# Patient Record
Sex: Female | Born: 1990 | Race: Black or African American | Hispanic: No | Marital: Single | Smoking: Never smoker
Health system: Southern US, Community
[De-identification: ages and names within clinical notes are randomized; demographics above are authoritative.]

## PROBLEM LIST (undated history)

## (undated) DIAGNOSIS — Z789 Other specified health status: Secondary | ICD-10-CM

## (undated) HISTORY — PX: NO PAST SURGERIES: SHX2092

---

## 2009-07-15 ENCOUNTER — Emergency Department (HOSPITAL_COMMUNITY): Admission: EM | Admit: 2009-07-15 | Discharge: 2009-07-15 | Payer: Self-pay | Admitting: Emergency Medicine

## 2010-01-04 ENCOUNTER — Emergency Department (HOSPITAL_COMMUNITY)
Admission: EM | Admit: 2010-01-04 | Discharge: 2010-01-04 | Payer: Self-pay | Source: Home / Self Care | Admitting: Emergency Medicine

## 2010-05-16 LAB — URINE MICROSCOPIC-ADD ON

## 2010-05-16 LAB — CSF CELL COUNT WITH DIFFERENTIAL: WBC, CSF: 2 /mm3 (ref 0–5)

## 2010-05-16 LAB — BASIC METABOLIC PANEL
BUN: 13 mg/dL (ref 6–23)
Chloride: 102 mEq/L (ref 96–112)
Creatinine, Ser: 1.15 mg/dL (ref 0.4–1.2)
GFR calc non Af Amer: >60 mL/min (ref >60)
Glucose, Bld: 152 mg/dL — ABNORMAL HIGH (ref 70–99)
Potassium: 3.7 mEq/L (ref 3.5–5.1)

## 2010-05-16 LAB — CULTURE, BLOOD (ROUTINE X 2): Culture: NO GROWTH

## 2010-05-16 LAB — URINALYSIS, ROUTINE W REFLEX MICROSCOPIC
Bilirubin Urine: NEGATIVE
Glucose, UA: NEGATIVE mg/dL
Ketones, ur: NEGATIVE mg/dL
Protein, ur: 100 mg/dL — AB

## 2010-05-16 LAB — GLUCOSE, CSF: Glucose, CSF: 90 mg/dL — ABNORMAL HIGH (ref 43–76)

## 2010-05-16 LAB — CBC
HCT: 31.5 % — ABNORMAL LOW (ref 36.0–46.0)
MCV: 68.8 fL — ABNORMAL LOW (ref 78.0–100.0)
Platelets: 172 10*3/uL (ref 150–400)
RDW: 21.2 % — ABNORMAL HIGH (ref 11.5–15.5)

## 2010-05-16 LAB — HERPES SIMPLEX VIRUS(HSV) DNA BY PCR: HSV 2 DNA: NOT DETECTED

## 2010-05-16 LAB — CSF CULTURE W GRAM STAIN

## 2010-05-16 LAB — PROTEIN, CSF: Total  Protein, CSF: 20 mg/dL (ref 15–45)

## 2010-05-16 LAB — URINE CULTURE

## 2012-04-07 ENCOUNTER — Emergency Department (HOSPITAL_COMMUNITY)
Admission: EM | Admit: 2012-04-07 | Discharge: 2012-04-07 | Disposition: A | Discharge status: Home / Self Care | Payer: Self-pay | Attending: Emergency Medicine | Admitting: Emergency Medicine

## 2012-04-07 ENCOUNTER — Encounter (HOSPITAL_COMMUNITY): Payer: Self-pay | Admitting: *Deleted

## 2012-04-07 DIAGNOSIS — B9689 Other specified bacterial agents as the cause of diseases classified elsewhere: Secondary | ICD-10-CM

## 2012-04-07 DIAGNOSIS — N760 Acute vaginitis: Secondary | ICD-10-CM | POA: Insufficient documentation

## 2012-04-07 DIAGNOSIS — Z3202 Encounter for pregnancy test, result negative: Secondary | ICD-10-CM | POA: Insufficient documentation

## 2012-04-07 LAB — WET PREP, GENITAL
Trich, Wet Prep: NONE SEEN
Yeast Wet Prep HPF POC: NONE SEEN

## 2012-04-07 LAB — URINALYSIS, ROUTINE W REFLEX MICROSCOPIC
Glucose, UA: NEGATIVE mg/dL
Ketones, ur: NEGATIVE mg/dL
Leukocytes, UA: NEGATIVE
pH: 6 (ref 5.0–8.0)

## 2012-04-07 LAB — POCT PREGNANCY, URINE: Preg Test, Ur: NEGATIVE

## 2012-04-07 MED ORDER — METRONIDAZOLE 500 MG PO TABS: 500.0000 mg | ORAL_TABLET | Freq: Two times a day (BID) | ORAL | Status: DC

## 2012-04-07 NOTE — ED Provider Notes (Signed)
History/physical exam/procedure(s) were performed by non-physician practitioner and as supervising physician I was immediately available for consultation/collaboration. I have reviewed all notes and am in agreement with care and plan.   Hilario Quarry, MD 04/07/12 [phone]

## 2012-04-07 NOTE — ED Provider Notes (Signed)
History     CSN: [id number]  Arrival date & time 04/07/12  1019   First MD Initiated Contact with Patient 04/07/12 1148      No chief complaint on file.   (Consider location/radiation/quality/duration/timing/severity/associated sxs/prior treatment) Patient is a 22 y.o. female presenting with STD exposure. The history is provided by the patient. No language interpreter was used.  Exposure to STD This is a new problem. The current episode started in the past 7 days. The problem has been unchanged. Pertinent negatives include no fever, nausea or vomiting. Nothing aggravates the symptoms. She has tried nothing for the symptoms.   22 year old female coming in with complaint of exposure to STD. He was she was dating a female partner for 3 years and he has been sleeping around. His girl friend told the patient that she had trich says she wants to be checked for trichomoniasis. She has no vaginal discharge. She has no abdominal pain. No nausea vomiting or fever.   History reviewed. No pertinent past medical history.  History reviewed. No pertinent past surgical history.  History reviewed. No pertinent family history.  History  Substance Use Topics  . Smoking status: Never Smoker   . Smokeless tobacco: Never Used  . Alcohol Use: No    OB History   Grav Para Term Preterm Abortions TAB SAB Ect Mult Living                  Review of Systems  Constitutional: Negative.  Negative for fever.  HENT: Negative.   Eyes: Negative.   Respiratory: Negative.   Cardiovascular: Negative.   Gastrointestinal: Negative.  Negative for nausea and vomiting.  Neurological: Negative.   Psychiatric/Behavioral: Negative.   All other systems reviewed and are negative.    Allergies  Review of patient's allergies indicates no known allergies.  Home Medications   Current Outpatient Rx  Name  Route  Sig  Dispense  Refill  . metroNIDAZOLE (FLAGYL) 500 MG tablet   Oral   Take 1 tablet (500 mg total) by  mouth 2 (two) times daily.   14 tablet   0     BP 103/65  Pulse 65  Temp(Src) 98.2 F (36.8 C) (Oral)  Resp 18  SpO2 100%  LMP 03/28/2012  Physical Exam  Nursing note and vitals reviewed. Constitutional: She is oriented to person, place, and time. She appears well-developed and well-nourished.  HENT:  Head: Normocephalic and atraumatic.  Eyes: Conjunctivae and EOM are normal. Pupils are equal, round, and reactive to light.  Neck: Normal range of motion. Neck supple.  Cardiovascular: Normal rate.   Pulmonary/Chest: Effort normal.  Abdominal: Soft.  Genitourinary: Vaginal discharge found.  Musculoskeletal: Normal range of motion. She exhibits no edema and no tenderness.  Neurological: She is alert and oriented to person, place, and time. She has normal reflexes.  Skin: Skin is warm and dry.  Psychiatric: She has a normal mood and affect.    ED Course  Pelvic exam Date/Time: 04/07/2012 1:43 PM Performed by: Remi Haggard Authorized by: Remi Haggard Consent: Verbal consent obtained. Risks and benefits: risks, benefits and alternatives were discussed Consent given by: patient Patient understanding: patient states understanding of the procedure being performed Patient identity confirmed: verbally with patient, arm band, provided demographic data and hospital-assigned identification number Preparation: Patient was prepped and draped in the usual sterile fashion. Local anesthesia used: no Patient sedated: no Patient tolerance: Patient tolerated the procedure well with no immediate complications.   (including critical care time)  Labs Reviewed  WET PREP, GENITAL - Abnormal; Notable for the following:    Clue Cells Wet Prep HPF POC MODERATE (*)    All other components within normal limits  URINALYSIS, ROUTINE W REFLEX MICROSCOPIC - Abnormal; Notable for the following:    APPearance CLOUDY (*)    All other components within normal limits  GC/CHLAMYDIA PROBE AMP  POCT  PREGNANCY, URINE   No results found.   1. Bacterial vaginitis       MDM   bacterial vaginosis. Prescription for Flagyl. She'll followup with free [facility]D clinic in one week.        Remi Haggard, NP 04/07/12 1344

## 2012-04-07 NOTE — ED Notes (Signed)
Pt states she's been with the same guy x 3 years, found out he slept with another girl, girl stated she had std's, one was trichomoniasis, pt states a week or two ago noticed an abnormal discharge, unable to describe discharge. Then states had a cut in vaginal area that is healed now.

## 2012-04-07 NOTE — ED Provider Notes (Signed)
Patient signed out to me by Dr. Rulon Abide after patient had her telemetry psychiatry consult it was recommended that she be admitted for inpatient treatment. Medication recommendations were made and the abdomen entered with the exception of Geodon because the facility does not have that dose  Toy Baker, MD 04/07/12 1143

## 2012-04-09 LAB — GC/CHLAMYDIA PROBE AMP: CT Probe RNA: NEGATIVE

## 2012-04-30 ENCOUNTER — Encounter (HOSPITAL_COMMUNITY): Payer: Self-pay | Admitting: Emergency Medicine

## 2012-04-30 ENCOUNTER — Emergency Department (HOSPITAL_COMMUNITY)
Admission: EM | Admit: 2012-04-30 | Discharge: 2012-04-30 | Disposition: A | Discharge status: Home / Self Care | Payer: Self-pay | Attending: Emergency Medicine | Admitting: Emergency Medicine

## 2012-04-30 DIAGNOSIS — T394X2A Poisoning by antirheumatics, not elsewhere classified, intentional self-harm, initial encounter: Secondary | ICD-10-CM | POA: Insufficient documentation

## 2012-04-30 DIAGNOSIS — Z3202 Encounter for pregnancy test, result negative: Secondary | ICD-10-CM | POA: Insufficient documentation

## 2012-04-30 DIAGNOSIS — T398X2A Poisoning by other nonopioid analgesics and antipyretics, not elsewhere classified, intentional self-harm, initial encounter: Secondary | ICD-10-CM | POA: Insufficient documentation

## 2012-04-30 DIAGNOSIS — T391X1A Poisoning by 4-Aminophenol derivatives, accidental (unintentional), initial encounter: Secondary | ICD-10-CM | POA: Insufficient documentation

## 2012-04-30 LAB — SALICYLATE LEVEL: Salicylate Lvl: <2 mg/dL — ABNORMAL LOW (ref 2.8–20.0)

## 2012-04-30 LAB — ACETAMINOPHEN LEVEL: Acetaminophen (Tylenol), Serum: 84.9 ug/mL — ABNORMAL HIGH (ref 10–30)

## 2012-04-30 LAB — RAPID URINE DRUG SCREEN, HOSP PERFORMED
Amphetamines: NOT DETECTED
Benzodiazepines: NOT DETECTED
Cocaine: NOT DETECTED
Opiates: NOT DETECTED

## 2012-04-30 LAB — COMPREHENSIVE METABOLIC PANEL
AST: 20 U/L (ref 0–37)
Albumin: 4.3 g/dL (ref 3.5–5.2)
BUN: 9 mg/dL (ref 6–23)
Chloride: 104 mEq/L (ref 96–112)
Creatinine, Ser: 0.75 mg/dL (ref 0.50–1.10)
Total Bilirubin: 0.5 mg/dL (ref 0.3–1.2)
Total Protein: 8.5 g/dL — ABNORMAL HIGH (ref 6.0–8.3)

## 2012-04-30 LAB — CBC
HCT: 44.3 % (ref 36.0–46.0)
MCHC: 32.7 g/dL (ref 30.0–36.0)
MCV: 86 fL (ref 78.0–100.0)
RDW: 13.7 % (ref 11.5–15.5)

## 2012-04-30 LAB — PREGNANCY, URINE: Preg Test, Ur: NEGATIVE

## 2012-04-30 MED ORDER — ALUM & MAG HYDROXIDE-SIMETH 200-200-20 MG/5ML PO SUSP: 30.0000 mL | ORAL | Status: DC | PRN

## 2012-04-30 MED ORDER — ONDANSETRON HCL 4 MG PO TABS: 4.0000 mg | ORAL_TABLET | Freq: Three times a day (TID) | ORAL | Status: DC | PRN

## 2012-04-30 MED ORDER — IBUPROFEN 600 MG PO TABS: 600.0000 mg | ORAL_TABLET | Freq: Three times a day (TID) | ORAL | Status: DC | PRN

## 2012-04-30 MED ORDER — NICOTINE 21 MG/24HR TD PT24: 21.0000 mg | MEDICATED_PATCH | Freq: Every day | TRANSDERMAL | Status: DC

## 2012-04-30 MED ORDER — ZOLPIDEM TARTRATE 5 MG PO TABS: 5.0000 mg | ORAL_TABLET | Freq: Every evening | ORAL | Status: DC | PRN

## 2012-04-30 MED ORDER — SODIUM CHLORIDE 0.9 % IV BOLUS (SEPSIS)
500.0000 mL | Freq: Once | INTRAVENOUS | Status: AC
  Administered 2012-04-30: 500 mL via INTRAVENOUS

## 2012-04-30 MED ORDER — LORAZEPAM 1 MG PO TABS: 1.0000 mg | ORAL_TABLET | Freq: Three times a day (TID) | ORAL | Status: DC | PRN

## 2012-04-30 NOTE — ED Notes (Signed)
Pt lethargic at this time. Eye open upon verbal stimulation.

## 2012-04-30 NOTE — ED Notes (Signed)
Pt presenting to ed with c/o taking approximately 9 extra strength pain reliever PM. Pt states she was trying to go to sleep forever. Pt states she took medication at 8:00am. Pt is alert at the time. Pt admits to SI attempt.

## 2012-04-30 NOTE — ED Notes (Signed)
Poison control called about update status on pt.

## 2012-04-30 NOTE — ED Notes (Signed)
I attempted x2 unsuccessfully to obtain blood. RN Taquita notified.

## 2012-04-30 NOTE — ED Notes (Signed)
One bag in locker 34 

## 2012-04-30 NOTE — ED Notes (Signed)
Posion Control recommends that we repeat a tylenol level in 1 hour and supportive care. Follow up with psych consult. Monitor until non symptomatic.

## 2012-04-30 NOTE — ED Notes (Signed)
Pt arrived to unit; no s/s of distress noted. Pt pleasant and cooperative with care.

## 2012-04-30 NOTE — ED Notes (Signed)
Sitter at bedside.

## 2012-04-30 NOTE — Progress Notes (Signed)
Telepsych completed, recommending patient can discharge home. Pt reports to this Clinical research associate remorse for actions. Pt reports remorse and looking forward to the future in joining the [employer]. Pt able to contract for safety. CSW and pt discussed outpatient resources. Pt discussed with EDP. Pt psychiatrically and medically stable for discharge.   Doree Albee  [phone] 04/30/2012 10:53pm

## 2012-04-30 NOTE — ED Notes (Signed)
Poison control called to check status of pt.

## 2012-04-30 NOTE — ED Provider Notes (Signed)
History     CSN: [id number]  Arrival date & time 04/30/12  1146   First MD Initiated Contact with Patient 04/30/12 1151      Chief Complaint  Patient presents with  . Medical Clearance    (Consider location/radiation/quality/duration/timing/severity/associated sxs/prior treatment) HPI A LEVEL 5 CAVEAT PERTAINS DUE TO ALTERED MENTAL STATUS Pt presents with intentional overdose of generic tylenol pm.  She states she took 9 pills at 9am.  States this was an attempt to "sleep forever".   History reviewed. No pertinent past medical history.  History reviewed. No pertinent past surgical history.  No family history on file.  History  Substance Use Topics  . Smoking status: Never Smoker   . Smokeless tobacco: Never Used  . Alcohol Use: No    OB History   Grav Para Term Preterm Abortions TAB SAB Ect Mult Living                  Review of Systems UNABLE TO OBTAIN ROS DUE TO LEVEL 5 CAVEAT  Allergies  Review of patient's allergies indicates no known allergies.  Home Medications   Current Outpatient Rx  Name  Route  Sig  Dispense  Refill  . diphenhydramine-acetaminophen (TYLENOL PM) 25-500 MG TABS   Oral   Take 1 tablet by mouth at bedtime as needed (sleep).           BP 111/72  Pulse 72  Temp(Src) 98.3 F (36.8 C) (Oral)  Resp 16  SpO2 100%  LMP 04/23/2012 Vitals reviewed Physical Exam Physical Examination: General appearance - somnolent, arousable to voice and pain, and in no distress Mental status - somnolent, oriented to person and place Eyes - pupils equal and reactive, extraocular eye movements intact, no nystagmus, no scleral icterus, no conjunctival injection Mouth - mucous membranes moist, pharynx normal without lesions Chest - clear to auscultation, no wheezes, rales or rhonchi, symmetric air entry Heart - normal rate, regular rhythm, normal S1, S2, no murmurs, rubs, clicks or gallops Abdomen - soft, nontender, nondistended, no masses or  organomegaly, nabs Neurological - alert, oriented, normal speech, no focal findings or movement disorder noted Extremities - peripheral pulses normal, no pedal edema, no clubbing or cyanosis Skin - normal coloration and turgor, no rashes Psych- flat affect  ED Course  Procedures (including critical care time)   Date: 04/30/2012  Rate: 104  Rhythm: sinus tachycardia  QRS Axis: normal  Intervals: normal  ST/T Wave abnormalities: nonspecific T wave changes  Conduction Disutrbances:none  Narrative Interpretation:   Old EKG Reviewed: no significant changes compared to prior ekg of 07/15/09  4:10 PM pt rechecked and is much more awake and alert.  She does endorse taking the pills at 9am.  She does endorse that this was a suicide attempt.  Pt is now medically cleared, 4 hour tylenol is under the level for treatment.  Psych holding orders written and pt has a sitter at her bedside.  Pt to be moved to psych ED.    Labs Reviewed  CBC - Abnormal; Notable for the following:    RBC 5.15 (*)    All other components within normal limits  COMPREHENSIVE METABOLIC PANEL - Abnormal; Notable for the following:    Potassium 3.4 (*)    Glucose, Bld 102 (*)    Total Protein 8.5 (*)    All other components within normal limits  ACETAMINOPHEN LEVEL - Abnormal; Notable for the following:    Acetaminophen (Tylenol), Serum 84.9 (*)  All other components within normal limits  SALICYLATE LEVEL - Abnormal; Notable for the following:    Salicylate Lvl <2.0 (*)    All other components within normal limits  ACETAMINOPHEN LEVEL - Abnormal; Notable for the following:    Acetaminophen (Tylenol), Serum 71.1 (*)    All other components within normal limits  URINE RAPID DRUG SCREEN (HOSP PERFORMED)  PREGNANCY, URINE   No results found.   1. Drug overdose, initial encounter       MDM  Pt presenting via EMS after intentional overdose of tylenol pm.  Pt has been observed x 4 hours in ED, tylenol level at 4  hours after ingestion is below nomogram for treatment and trending down.  Pt medically cleared, moved to psych ED for further evaluation.  Psych holding orders written        Ethelda Chick, MD 05/07/12 [phone]

## 2013-12-16 ENCOUNTER — Emergency Department (HOSPITAL_COMMUNITY)
Admission: EM | Admit: 2013-12-16 | Discharge: 2013-12-17 | Disposition: A | Discharge status: Home / Self Care | Payer: Self-pay | Attending: Emergency Medicine | Admitting: Emergency Medicine

## 2013-12-16 ENCOUNTER — Encounter (HOSPITAL_COMMUNITY): Payer: Self-pay | Admitting: Emergency Medicine

## 2013-12-16 DIAGNOSIS — B9689 Other specified bacterial agents as the cause of diseases classified elsewhere: Secondary | ICD-10-CM

## 2013-12-16 DIAGNOSIS — N760 Acute vaginitis: Secondary | ICD-10-CM | POA: Insufficient documentation

## 2013-12-16 DIAGNOSIS — Z3202 Encounter for pregnancy test, result negative: Secondary | ICD-10-CM | POA: Insufficient documentation

## 2013-12-16 LAB — URINALYSIS, ROUTINE W REFLEX MICROSCOPIC
BILIRUBIN URINE: NEGATIVE
Glucose, UA: NEGATIVE mg/dL
Hgb urine dipstick: NEGATIVE
KETONES UR: NEGATIVE mg/dL
LEUKOCYTES UA: NEGATIVE
NITRITE: NEGATIVE
Protein, ur: NEGATIVE mg/dL
Specific Gravity, Urine: 1.029 (ref 1.005–1.030)
UROBILINOGEN UA: 1 mg/dL (ref 0.0–1.0)
pH: 6 (ref 5.0–8.0)

## 2013-12-16 LAB — POC URINE PREG, ED: PREG TEST UR: NEGATIVE

## 2013-12-16 NOTE — ED Notes (Signed)
Pt complains of a thick discharge and urinary retention since yesterday

## 2013-12-17 LAB — GC/CHLAMYDIA PROBE AMP
CT PROBE, AMP APTIMA: NEGATIVE
GC Probe RNA: NEGATIVE

## 2013-12-17 LAB — WET PREP, GENITAL
TRICH WET PREP: NONE SEEN
YEAST WET PREP: NONE SEEN

## 2013-12-17 MED ORDER — METRONIDAZOLE 500 MG PO TABS: 500.0000 mg | ORAL_TABLET | Freq: Two times a day (BID) | ORAL | Status: DC

## 2013-12-17 NOTE — Discharge Instructions (Signed)
Bacterial Vaginosis Bacterial vaginosis is a vaginal infection that occurs when the normal balance of bacteria in the vagina is disrupted. It results from an overgrowth of certain bacteria. This is the most common vaginal infection in women of childbearing age. Treatment is important to prevent complications, especially in pregnant women, as it can cause a premature delivery. CAUSES  Bacterial vaginosis is caused by an increase in harmful bacteria that are normally present in smaller amounts in the vagina. Several different kinds of bacteria can cause bacterial vaginosis. However, the reason that the condition develops is not fully understood. RISK FACTORS Certain activities or behaviors can put you at an increased risk of developing bacterial vaginosis, including:  Having a new sex partner or multiple sex partners.  Douching.  Using an intrauterine device (IUD) for contraception. Women do not get bacterial vaginosis from toilet seats, bedding, swimming pools, or contact with objects around them. SIGNS AND SYMPTOMS  Some women with bacterial vaginosis have no signs or symptoms. Common symptoms include:  Grey vaginal discharge.  A fishlike odor with discharge, especially after sexual intercourse.  Itching or burning of the vagina and vulva.  Burning or pain with urination. DIAGNOSIS  Your health care provider will take a medical history and examine the vagina for signs of bacterial vaginosis. A sample of vaginal fluid may be taken. Your health care provider will look at this sample under a microscope to check for bacteria and abnormal cells. A vaginal pH test may also be done.  TREATMENT  Bacterial vaginosis may be treated with antibiotic medicines. These may be given in the form of a pill or a vaginal cream. A second round of antibiotics may be prescribed if the condition comes back after treatment.  HOME CARE INSTRUCTIONS   Only take over-the-counter or prescription medicines as  directed by your health care provider.  If antibiotic medicine was prescribed, take it as directed. Make sure you finish it even if you start to feel better.  Do not have sex until treatment is completed.  Tell all sexual partners that you have a vaginal infection. They should see their health care provider and be treated if they have problems, such as a mild rash or itching.  Practice safe sex by using condoms and only having one sex partner. SEEK MEDICAL CARE IF:   Your symptoms are not improving after 3 days of treatment.  You have increased discharge or pain.  You have a fever. MAKE SURE YOU:   Understand these instructions.  Will watch your condition.  Will get help right away if you are not doing well or get worse. FOR MORE INFORMATION  Centers for Disease Control and Prevention, Division of STD Prevention: www.cdc.gov/std American Sexual Health Association (ASHA): www.ashastd.org  Document Released: 02/13/2005 Document Revised: 12/04/2012 Document Reviewed: 09/25/2012 ExitCare Patient Information 2015 [employer]. This information is not intended to replace advice given to you by your health care provider. Make sure you discuss any questions you have with your health care provider.  

## 2013-12-17 NOTE — ED Provider Notes (Signed)
Medical screening examination/treatment/procedure(s) were performed by non-physician practitioner and as supervising physician I was immediately available for consultation/collaboration.   EKG Interpretation None       Rosaleen Mazer M Jaxston Chohan, MD 12/17/13 0717 

## 2013-12-17 NOTE — ED Provider Notes (Signed)
CSN: [id number]     Arrival date & time 12/16/13  2041 History   First MD Initiated Contact with Patient 12/16/13 2306     Chief Complaint  Patient presents with  . Vaginal Discharge     (Consider location/radiation/quality/duration/timing/severity/associated sxs/prior Treatment) HPI Comments: Patient is a 23 year old otherwise healthy female who presents to the emergency department today for evaluation of vaginal discharge. She reports that she noticed her vaginal discharge last night. It is malodorous. She feels as though when she is urinating only a few drops come out at a time. She denies any vaginal pain, dyspareunia, abdominal pain, nausea, vomiting, fever, chills.  Patient is a 23 y.o. female presenting with vaginal discharge. The history is provided by the patient. No language interpreter was used.  Vaginal Discharge Associated symptoms: no abdominal pain, no dysuria, no fever, no nausea and no vomiting     History reviewed. No pertinent past medical history. History reviewed. No pertinent past surgical history. History reviewed. No pertinent family history. History  Substance Use Topics  . Smoking status: Never Smoker   . Smokeless tobacco: Never Used  . Alcohol Use: No   OB History   Grav Para Term Preterm Abortions TAB SAB Ect Mult Living                 Review of Systems  Constitutional: Negative for fever and chills.  Respiratory: Negative for shortness of breath.   Cardiovascular: Negative for chest pain.  Gastrointestinal: Negative for nausea, vomiting and abdominal pain.  Genitourinary: Positive for vaginal discharge and difficulty urinating. Negative for dysuria, frequency and pelvic pain.  All other systems reviewed and are negative.     Allergies  Review of patient's allergies indicates no known allergies.  Home Medications   Prior to Admission medications   Not on File   BP 131/82  Pulse 77  Temp(Src) 98.3 F (36.8 C) (Oral)  Resp 16  SpO2  100%  LMP 11/09/2013 Physical Exam  Nursing note and vitals reviewed. Constitutional: She is oriented to person, place, and time. She appears well-developed and well-nourished. No distress.  Well appearing  HENT:  Head: Normocephalic and atraumatic.  Right Ear: External ear normal.  Left Ear: External ear normal.  Nose: Nose normal.  Mouth/Throat: Oropharynx is clear and moist.  Eyes: Conjunctivae are normal.  Neck: Normal range of motion.  Cardiovascular: Normal rate, regular rhythm and normal heart sounds.   Pulmonary/Chest: Effort normal and breath sounds normal. No stridor. No respiratory distress. She has no wheezes. She has no rales.  Abdominal: Soft. She exhibits no distension.  Genitourinary: There is no rash, tenderness or lesion on the right labia. There is no rash, tenderness or lesion on the left labia. Cervix exhibits discharge. Cervix exhibits no motion tenderness and no friability. Right adnexum displays no mass, no tenderness and no fullness. Left adnexum displays no mass, no tenderness and no fullness. No erythema around the vagina. No foreign body around the vagina. No signs of injury around the vagina. Vaginal discharge found.  White vaginal discharge. No cervical motion tenderness.  Musculoskeletal: Normal range of motion.  Neurological: She is alert and oriented to person, place, and time. She has normal strength.  Skin: Skin is warm and dry. She is not diaphoretic. No erythema.  Psychiatric: She has a normal mood and affect. Her behavior is normal.    ED Course  Procedures (including critical care time) Labs Review Labs Reviewed  WET PREP, GENITAL - Abnormal; Notable for the  following:    Clue Cells Wet Prep HPF POC MODERATE (*)    WBC, Wet Prep HPF POC FEW (*)    All other components within normal limits  GC/CHLAMYDIA PROBE AMP  URINALYSIS, ROUTINE W REFLEX MICROSCOPIC  POC URINE PREG, ED    Imaging Review No results found.   EKG Interpretation None       MDM   Final diagnoses:  Bacterial vaginosis   Patient presents to emergency department for evaluation of vaginal discharge. Wet prep shows white vaginal discharge without cervical motion tenderness. Moderate clue cells seen on wet prep. Will treat for bacterial vaginosis. Discussed no drinking alcohol with Flagyl. Patient understands that she will receive a phone call in the next 2 days if gonorrhea/Chlamydia are positive. Discussed reasons to return to emergency department immediately. Vital signs stable for discharge. Patient / Family / Caregiver informed of clinical course, understand medical decision-making process, and agree with plan.    Mora BellmanHannah S Cristiana Yochim, PA-C 12/17/13 [phone]

## 2019-04-04 ENCOUNTER — Other Ambulatory Visit: Payer: Self-pay

## 2019-04-04 ENCOUNTER — Inpatient Hospital Stay (HOSPITAL_COMMUNITY): Payer: Medicaid Other

## 2019-04-04 ENCOUNTER — Inpatient Hospital Stay (HOSPITAL_COMMUNITY)
Admission: AD | Admit: 2019-04-04 | Discharge: 2019-04-04 | Disposition: A | Discharge status: Home / Self Care | Payer: Medicaid Other | Attending: Obstetrics & Gynecology | Admitting: Obstetrics & Gynecology

## 2019-04-04 ENCOUNTER — Encounter (HOSPITAL_COMMUNITY): Payer: Self-pay | Admitting: Obstetrics & Gynecology

## 2019-04-04 DIAGNOSIS — R109 Unspecified abdominal pain: Secondary | ICD-10-CM | POA: Diagnosis present

## 2019-04-04 DIAGNOSIS — O4702 False labor before 37 completed weeks of gestation, second trimester: Secondary | ICD-10-CM | POA: Diagnosis not present

## 2019-04-04 DIAGNOSIS — R102 Pelvic and perineal pain: Secondary | ICD-10-CM

## 2019-04-04 DIAGNOSIS — Z3A11 11 weeks gestation of pregnancy: Secondary | ICD-10-CM | POA: Diagnosis not present

## 2019-04-04 DIAGNOSIS — O26891 Other specified pregnancy related conditions, first trimester: Secondary | ICD-10-CM

## 2019-04-04 DIAGNOSIS — Z3A01 Less than 8 weeks gestation of pregnancy: Secondary | ICD-10-CM

## 2019-04-04 DIAGNOSIS — Z3490 Encounter for supervision of normal pregnancy, unspecified, unspecified trimester: Secondary | ICD-10-CM

## 2019-04-04 DIAGNOSIS — O479 False labor, unspecified: Secondary | ICD-10-CM

## 2019-04-04 HISTORY — DX: Other specified health status: Z78.9

## 2019-04-04 HISTORY — DX: False labor, unspecified: O47.9

## 2019-04-04 LAB — WET PREP, GENITAL
Sperm: NONE SEEN
Trich, Wet Prep: NONE SEEN
Yeast Wet Prep HPF POC: NONE SEEN

## 2019-04-04 LAB — URINALYSIS, ROUTINE W REFLEX MICROSCOPIC
Bilirubin Urine: NEGATIVE
Glucose, UA: NEGATIVE mg/dL
Hgb urine dipstick: NEGATIVE
Ketones, ur: 5 mg/dL — AB
Nitrite: NEGATIVE
Protein, ur: NEGATIVE mg/dL
Specific Gravity, Urine: 1.027 (ref 1.005–1.030)
pH: 6 (ref 5.0–8.0)

## 2019-04-04 LAB — CBC
HCT: 38.1 % (ref 36.0–46.0)
Hemoglobin: 12.9 g/dL (ref 12.0–15.0)
MCH: 29.3 pg (ref 26.0–34.0)
MCHC: 33.9 g/dL (ref 30.0–36.0)
MCV: 86.6 fL (ref 80.0–100.0)
Platelets: 194 10*3/uL (ref 150–400)
RBC: 4.4 MIL/uL (ref 3.87–5.11)
RDW: 15.5 % (ref 11.5–15.5)
WBC: 5 10*3/uL (ref 4.0–10.5)
nRBC: 0 % (ref 0.0–0.2)

## 2019-04-04 LAB — POCT PREGNANCY, URINE: Preg Test, Ur: POSITIVE — AB

## 2019-04-04 LAB — HCG, QUANTITATIVE, PREGNANCY: hCG, Beta Chain, Quant, S: 61306 m[IU]/mL — ABNORMAL HIGH (ref <5)

## 2019-04-04 LAB — HIV ANTIBODY (ROUTINE TESTING W REFLEX): HIV Screen 4th Generation wRfx: NONREACTIVE

## 2019-04-04 NOTE — MAU Note (Signed)
Woke up and was having cramps in her lower stomach.  No bleeding.  Has been seen at [facility], appt next Wed.

## 2019-04-04 NOTE — MAU Provider Note (Signed)
Chief Complaint: Abdominal Cramping   None    SUBJECTIVE HPI: Shelby Carlson is a 29 y.o. G1P0 at [redacted]w[redacted]d who presents to [facility] reporting abdominal cramping that started at 4 am this morning. The cramping is located in her lower pelvic region. The cramping episodes lasts for 1 minute and return every 2-3 hours.   Location: Lower pelvic region Quality: Cramping Severity: 6/10 on pain scale Duration: 1 minute Timing: every 2-3 hours Modifying factors: Pressure makes it feel worse Associated signs and symptoms: None  Past Medical History:  Diagnosis Date  . Braxton Hick's contraction 04/04/2019  . Medical history non-contributory    OB History  Gravida Para Term Preterm AB Living  1            SAB TAB Ectopic Multiple Live Births               # Outcome Date GA Lbr Len/2nd Weight Sex Delivery Anes PTL Lv  1 Current            Past Surgical History:  Procedure Laterality Date  . NO PAST SURGERIES     Social History   Socioeconomic History  . Marital status: Single    Spouse name: Not on file  . Number of children: Not on file  . Years of education: Not on file  . Highest education level: Not on file  Occupational History  . Not on file  Tobacco Use  . Smoking status: Never Smoker  . Smokeless tobacco: Never Used  Substance and Sexual Activity  . Alcohol use: No  . Drug use: No  . Sexual activity: Yes  Other Topics Concern  . Not on file  Social History Narrative  . Not on file   Social Determinants of Health   Financial Resource Strain:   . Difficulty of Paying Living Expenses: Not on file  Food Insecurity:   . Worried About Programme researcher, broadcasting/film/video in the Last Year: Not on file  . Ran Out of Food in the Last Year: Not on file  Transportation Needs:   . Lack of Transportation (Medical): Not on file  . Lack of Transportation (Non-Medical): Not on file  Physical Activity:   . Days of Exercise per Week: Not on file  . Minutes of Exercise per  Session: Not on file  Stress:   . Feeling of Stress : Not on file  Social Connections:   . Frequency of Communication with Friends and Family: Not on file  . Frequency of Social Gatherings with Friends and Family: Not on file  . Attends Religious Services: Not on file  . Active Member of Clubs or Organizations: Not on file  . Attends Banker Meetings: Not on file  . Marital Status: Not on file  Intimate Partner Violence:   . Fear of Current or Ex-Partner: Not on file  . Emotionally Abused: Not on file  . Physically Abused: Not on file  . Sexually Abused: Not on file   History reviewed. No pertinent family history. No current facility-administered medications on file prior to encounter.   Current Outpatient Medications on File Prior to Encounter  Medication Sig Dispense Refill  . metroNIDAZOLE (FLAGYL) 500 MG tablet Take 1 tablet (500 mg total) by mouth 2 (two) times daily. One po bid x 7 days 14 tablet 0   No Known Allergies  I have reviewed patient's Past Medical Hx, Surgical Hx, Family Hx, Social Hx, medications and allergies.   Review of Systems  Constitutional: Negative for fever.  Gastrointestinal: Negative for vomiting.  Genitourinary: Negative for dyspareunia, dysuria, vaginal bleeding and vaginal discharge.  All other systems reviewed and are negative.   OBJECTIVE Patient Vitals for the past 24 hrs:  BP Temp Temp src Pulse Resp SpO2 Height Weight  04/04/19 0926 120/72 98.8 F (37.1 C) Oral 81 16 100 % 5\' 8"  (1.727 m) 58.4 kg   Constitutional: Well-developed, well-nourished female in no acute distress.  Cardiovascular: normal rate & rhythm, no murmur Respiratory: normal rate and effort. Lung sounds clear throughout GI: Abd soft, non-tender, Pos BS x 4. No guarding or rebound tenderness MS: Extremities nontender, no edema, normal ROM Neurologic: Alert and oriented x 4.   LAB RESULTS Results for orders placed or performed during the hospital encounter  of 04/04/19 (from the past 24 hour(s))  Pregnancy, urine POC     Status: Abnormal   Collection Time: 04/04/19 10:11 AM  Result Value Ref Range   Preg Test, Ur POSITIVE (A) NEGATIVE    IMAGING No results found.  MAU COURSE Orders Placed This Encounter  Procedures  . Urinalysis, Routine w reflex microscopic  . Pregnancy, urine POC   No orders of the defined types were placed in this encounter.   MDM In Gestational age of [redacted]W[redacted]D, cramping without any GU, vaginal bleeding or constitutional symptoms are likely Braxton Hicks contractions. FHR and [place] were ordered to confirm viable pregnancy.   ASSESSMENT  1. Braxton Hick's contraction     PLAN Discharge home in stable condition. - F/up with outpatient OBGYN.   Allergies as of 04/04/2019   No Known Allergies        Trinna Post, Medical Student 04/04/2019  10:43 AM

## 2019-04-04 NOTE — MAU Provider Note (Signed)
History     CSN: [id number]  Arrival date and time: 04/04/19 0900   First Provider Initiated Contact with Patient 04/04/19 1005      Chief Complaint  Patient presents with  . Abdominal Cramping   Shelby Carlson is a 29 y.o. G1P0 at [redacted]w[redacted]d who presents today with cramping. She denies any bleeding.   Pelvic Pain The patient's primary symptoms include pelvic pain. The patient's pertinent negatives include no vaginal discharge. This is a new problem. The current episode started today. The problem occurs intermittently. The problem has been unchanged. The pain is moderate. The problem affects both sides. She is pregnant. Pertinent negatives include no chills, dysuria, fever, frequency, nausea or vomiting. The vaginal discharge was normal. There has been no bleeding. Nothing aggravates the symptoms. She has tried nothing for the symptoms. Her menstrual history has been irregular (LMP 01/17/19).    OB History    Gravida  1   Para      Term      Preterm      AB      Living        SAB      TAB      Ectopic      Multiple      Live Births              Past Medical History:  Diagnosis Date  . Braxton Hick's contraction 04/04/2019  . Medical history non-contributory     Past Surgical History:  Procedure Laterality Date  . NO PAST SURGERIES      History reviewed. No pertinent family history.  Social History   Tobacco Use  . Smoking status: Never Smoker  . Smokeless tobacco: Never Used  Substance Use Topics  . Alcohol use: No  . Drug use: No    Allergies: No Known Allergies  Medications Prior to Admission  Medication Sig Dispense Refill Last Dose  . metroNIDAZOLE (FLAGYL) 500 MG tablet Take 1 tablet (500 mg total) by mouth 2 (two) times daily. One po bid x 7 days 14 tablet 0     Review of Systems  Constitutional: Negative for chills and fever.  Gastrointestinal: Negative for nausea and vomiting.  Genitourinary: Positive for pelvic pain. Negative for  dysuria, frequency, vaginal bleeding and vaginal discharge.   Physical Exam   Blood pressure 120/72, pulse 81, temperature 98.8 F (37.1 C), temperature source Oral, resp. rate 16, height 5\' 8"  (1.727 m), weight 58.4 kg, last menstrual period 01/17/2019, SpO2 100 %.  Physical Exam  Nursing note and vitals reviewed. Constitutional: She is oriented to person, place, and time. She appears well-developed and well-nourished. No distress.  HENT:  Head: Normocephalic.  Cardiovascular: Normal rate.  Respiratory: Effort normal.  GI: Soft. There is no abdominal tenderness. There is no rebound.  Neurological: She is alert and oriented to person, place, and time.  Skin: Skin is warm and dry.  Psychiatric: She has a normal mood and affect.   Results for orders placed or performed during the hospital encounter of 04/04/19 (from the past 24 hour(s))  Urinalysis, Routine w reflex microscopic     Status: Abnormal   Collection Time: 04/04/19 10:10 AM  Result Value Ref Range   Color, Urine YELLOW YELLOW   APPearance CLOUDY (A) CLEAR   Specific Gravity, Urine 1.027 1.005 - 1.030   pH 6.0 5.0 - 8.0   Glucose, UA NEGATIVE NEGATIVE mg/dL   Hgb urine dipstick NEGATIVE NEGATIVE   Bilirubin Urine NEGATIVE NEGATIVE  Ketones, ur 5 (A) NEGATIVE mg/dL   Protein, ur NEGATIVE NEGATIVE mg/dL   Nitrite NEGATIVE NEGATIVE   Leukocytes,Ua TRACE (A) NEGATIVE   RBC / HPF 0-5 0 - 5 RBC/hpf   WBC, UA 0-5 0 - 5 WBC/hpf   Bacteria, UA RARE (A) NONE SEEN   Squamous Epithelial / LPF 11-20 0 - 5   Mucus PRESENT   Pregnancy, urine POC     Status: Abnormal   Collection Time: 04/04/19 10:11 AM  Result Value Ref Range   Preg Test, Ur POSITIVE (A) NEGATIVE  CBC     Status: None   Collection Time: 04/04/19 11:01 AM  Result Value Ref Range   WBC 5.0 4.0 - 10.5 K/uL   RBC 4.40 3.87 - 5.11 MIL/uL   Hemoglobin 12.9 12.0 - 15.0 g/dL   HCT 38.1 36.0 - 46.0 %   MCV 86.6 80.0 - 100.0 fL   MCH 29.3 26.0 - 34.0 pg   MCHC  33.9 30.0 - 36.0 g/dL   RDW 15.5 11.5 - 15.5 %   Platelets 194 150 - 400 K/uL   nRBC 0.0 0.0 - 0.2 %  Wet prep, genital     Status: Abnormal   Collection Time: 04/04/19 11:11 AM   Specimen: Vaginal  Result Value Ref Range   Yeast Wet Prep HPF POC NONE SEEN NONE SEEN   Trich, Wet Prep NONE SEEN NONE SEEN   Clue Cells Wet Prep HPF POC PRESENT (A) NONE SEEN   WBC, Wet Prep HPF POC MANY (A) NONE SEEN   Sperm NONE SEEN    US OB LESS THAN 14 WEEKS WITH OB TRANSVAGINAL  Result Date: 04/04/2019 CLINICAL DATA:  Abdominal cramping for 1 day, assess fetal viability EXAM: OBSTETRIC <14 WK ULTRASOUND TECHNIQUE: Transabdominal ultrasound was performed for evaluation of the gestation as well as the maternal uterus and adnexal regions. COMPARISON:  None. FINDINGS: Intrauterine gestational sac: Single Yolk sac:  Visualized Embryo:  Visualized Cardiac Activity: Visualized Heart Rate: 137 bpm CRL:   9 mm   6 w 5 d                  [place] EDC: 11/23/2019 Subchorionic hemorrhage:  None visualized. Maternal uterus/adnexae: Left ovary measures 1.9 x 3.0 x 1.9 cm and right ovary measures 2.3 x 1.4 x 1.6 cm. No ovarian masses. Normal vascularity. IMPRESSION: 1. Single live intrauterine pregnancy as above, estimated age 91 weeks and 5 days. Electronically Signed   By: Randa Ngo M.D.   On: 04/04/2019 11:39    MAU Course  Procedures  MDM   Assessment and Plan   1. Pelvic pain in pregnancy, antepartum, first trimester   2. [redacted] weeks gestation of pregnancy   3. Intrauterine pregnancy    DC home Comfort measures reviewed  1st Trimester precautions  Bleeding precautions RX: none  Return to MAU as needed Start prenatal care ASAP    Angels, CNM  04/04/19  11:51 AM

## 2019-04-04 NOTE — Discharge Instructions (Signed)
First Trimester of Pregnancy The first trimester of pregnancy is from week 1 until the end of week 13 (months 1 through 3). A week after a sperm fertilizes an egg, the egg will implant on the wall of the uterus. This embryo will begin to develop into a baby. Genes from you and your partner will form the baby. The female genes will determine whether the baby will be a boy or a girl. At 6-8 weeks, the eyes and face will be formed, and the heartbeat can be seen on ultrasound. At the end of 12 weeks, all the baby's organs will be formed. Now that you are pregnant, you will want to do everything you can to have a healthy baby. Two of the most important things are to get good prenatal care and to follow your health care provider's instructions. Prenatal care is all the medical care you receive before the baby's birth. This care will help prevent, find, and treat any problems during the pregnancy and childbirth. Body changes during your first trimester Your body goes through many changes during pregnancy. The changes vary from woman to woman.  You may gain or lose a couple of pounds at first.  You may feel sick to your stomach (nauseous) and you may throw up (vomit). If the vomiting is uncontrollable, call your health care provider.  You may tire easily.  You may develop headaches that can be relieved by medicines. All medicines should be approved by your health care provider.  You may urinate more often. Painful urination may mean you have a bladder infection.  You may develop heartburn as a result of your pregnancy.  You may develop constipation because certain hormones are causing the muscles that push stool through your intestines to slow down.  You may develop hemorrhoids or swollen veins (varicose veins).  Your breasts may begin to grow larger and become tender. Your nipples may stick out more, and the tissue that surrounds them (areola) may become darker.  Your gums may bleed and may be  sensitive to brushing and flossing.  Dark spots or blotches (chloasma, mask of pregnancy) may develop on your face. This will likely fade after the baby is born.  Your menstrual periods will stop.  You may have a loss of appetite.  You may develop cravings for certain kinds of food.  You may have changes in your emotions from day to day, such as being excited to be pregnant or being concerned that something may go wrong with the pregnancy and baby.  You may have more vivid and strange dreams.  You may have changes in your hair. These can include thickening of your hair, rapid growth, and changes in texture. Some women also have hair loss during or after pregnancy, or hair that feels dry or thin. Your hair will most likely return to normal after your baby is born. What to expect at prenatal visits During a routine prenatal visit:  You will be weighed to make sure you and the baby are growing normally.  Your blood pressure will be taken.  Your abdomen will be measured to track your baby's growth.  The fetal heartbeat will be listened to between weeks 10 and 14 of your pregnancy.  Test results from any previous visits will be discussed. Your health care provider may ask you:  How you are feeling.  If you are feeling the baby move.  If you have had any abnormal symptoms, such as leaking fluid, bleeding, severe headaches, or abdominal   cramping.  If you are using any tobacco products, including cigarettes, chewing tobacco, and electronic cigarettes.  If you have any questions. Other tests that may be performed during your first trimester include:  Blood tests to find your blood type and to check for the presence of any previous infections. The tests will also be used to check for low iron levels (anemia) and protein on red blood cells (Rh antibodies). Depending on your risk factors, or if you previously had diabetes during pregnancy, you may have tests to check for high blood sugar  that affects pregnant women (gestational diabetes).  Urine tests to check for infections, diabetes, or protein in the urine.  An ultrasound to confirm the proper growth and development of the baby.  Fetal screens for spinal cord problems (spina bifida) and Down syndrome.  HIV (human immunodeficiency virus) testing. Routine prenatal testing includes screening for HIV, unless you choose not to have this test.  You may need other tests to make sure you and the baby are doing well. Follow these instructions at home: Medicines  Follow your health care provider's instructions regarding medicine use. Specific medicines may be either safe or unsafe to take during pregnancy.  Take a prenatal vitamin that contains at least 600 micrograms (mcg) of folic acid.  If you develop constipation, try taking a stool softener if your health care provider approves. Eating and drinking   Eat a balanced diet that includes fresh fruits and vegetables, whole grains, good sources of protein such as meat, eggs, or tofu, and low-fat dairy. Your health care provider will help you determine the amount of weight gain that is right for you.  Avoid raw meat and uncooked cheese. These carry germs that can cause birth defects in the baby.  Eating four or five small meals rather than three large meals a day may help relieve nausea and vomiting. If you start to feel nauseous, eating a few soda crackers can be helpful. Drinking liquids between meals, instead of during meals, also seems to help ease nausea and vomiting.  Limit foods that are high in fat and processed sugars, such as fried and sweet foods.  To prevent constipation: ? Eat foods that are high in fiber, such as fresh fruits and vegetables, whole grains, and beans. ? Drink enough fluid to keep your urine clear or pale yellow. Activity  Exercise only as directed by your health care provider. Most women can continue their usual exercise routine during  pregnancy. Try to exercise for 30 minutes at least 5 days a week. Exercising will help you: ? Control your weight. ? Stay in shape. ? Be prepared for labor and delivery.  Experiencing pain or cramping in the lower abdomen or lower back is a good sign that you should stop exercising. Check with your health care provider before continuing with normal exercises.  Try to avoid standing for long periods of time. Move your legs often if you must stand in one place for a long time.  Avoid heavy lifting.  Wear low-heeled shoes and practice good posture.  You may continue to have sex unless your health care provider tells you not to. Relieving pain and discomfort  Wear a good support bra to relieve breast tenderness.  Take warm sitz baths to soothe any pain or discomfort caused by hemorrhoids. Use hemorrhoid cream if your health care provider approves.  Rest with your legs elevated if you have leg cramps or low back pain.  If you develop varicose veins in   your legs, wear support hose. Elevate your feet for 15 minutes, 3-4 times a day. Limit salt in your diet. Prenatal care  Schedule your prenatal visits by the twelfth week of pregnancy. They are usually scheduled monthly at first, then more often in the last 2 months before delivery.  Write down your questions. Take them to your prenatal visits.  Keep all your prenatal visits as told by your health care provider. This is important. Safety  Wear your seat belt at all times when driving.  Make a list of emergency phone numbers, including numbers for family, friends, the hospital, and police and fire departments. General instructions  Ask your health care provider for a referral to a local prenatal education class. Begin classes no later than the beginning of month 6 of your pregnancy.  Ask for help if you have counseling or nutritional needs during pregnancy. Your health care provider can offer advice or refer you to specialists for help  with various needs.  Do not use hot tubs, steam rooms, or saunas.  Do not douche or use tampons or scented sanitary pads.  Do not cross your legs for long periods of time.  Avoid cat litter boxes and soil used by cats. These carry germs that can cause birth defects in the baby and possibly loss of the fetus by miscarriage or stillbirth.  Avoid all smoking, herbs, alcohol, and medicines not prescribed by your health care provider. Chemicals in these products affect the formation and growth of the baby.  Do not use any products that contain nicotine or tobacco, such as cigarettes and e-cigarettes. If you need help quitting, ask your health care provider. You may receive counseling support and other resources to help you quit.  Schedule a dentist appointment. At home, brush your teeth with a soft toothbrush and be gentle when you floss. Contact a health care provider if:  You have dizziness.  You have mild pelvic cramps, pelvic pressure, or nagging pain in the abdominal area.  You have persistent nausea, vomiting, or diarrhea.  You have a bad smelling vaginal discharge.  You have pain when you urinate.  You notice increased swelling in your face, hands, legs, or ankles.  You are exposed to fifth disease or chickenpox.  You are exposed to German measles (rubella) and have never had it. Get help right away if:  You have a fever.  You are leaking fluid from your vagina.  You have spotting or bleeding from your vagina.  You have severe abdominal cramping or pain.  You have rapid weight gain or loss.  You vomit blood or material that looks like coffee grounds.  You develop a severe headache.  You have shortness of breath.  You have any kind of trauma, such as from a fall or a car accident. Summary  The first trimester of pregnancy is from week 1 until the end of week 13 (months 1 through 3).  Your body goes through many changes during pregnancy. The changes vary from  woman to woman.  You will have routine prenatal visits. During those visits, your health care provider will examine you, discuss any test results you may have, and talk with you about how you are feeling. This information is not intended to replace advice given to you by your health care provider. Make sure you discuss any questions you have with your health care provider. Document Revised: 01/26/2017 Document Reviewed: 01/26/2016 Elsevier Patient Education  2020 [employer] Inc[employer]  

## 2019-04-04 NOTE — MAU Note (Addendum)
States she had a + UPT in October did not have blood work drawn then. Did have bleeding then. Most recent + UPT was Jan 6 and has been seen in the office. Had blood work drawn and hcg was 38333 and then was checked again 2 days later ~280000 and she was told it rose well. Scheduled for u/s on feb 8. Pt reports the office has been trying to figure out if she had a miscarriage back in October.

## 2019-04-07 LAB — GC/CHLAMYDIA PROBE AMP (~~LOC~~) NOT AT ARMC
Chlamydia: NEGATIVE
Comment: NEGATIVE
Comment: NORMAL
Neisseria Gonorrhea: NEGATIVE

## 2019-04-28 ENCOUNTER — Other Ambulatory Visit: Payer: Self-pay

## 2019-04-28 ENCOUNTER — Ambulatory Visit (INDEPENDENT_AMBULATORY_CARE_PROVIDER_SITE_OTHER): Payer: Medicaid Other | Admitting: *Deleted

## 2019-04-28 ENCOUNTER — Encounter: Payer: Self-pay | Admitting: Family Medicine

## 2019-04-28 ENCOUNTER — Telehealth: Payer: Self-pay | Admitting: Family Medicine

## 2019-04-28 DIAGNOSIS — Z3490 Encounter for supervision of normal pregnancy, unspecified, unspecified trimester: Secondary | ICD-10-CM

## 2019-04-28 NOTE — Progress Notes (Signed)
1:26 I called [facility] for her telephone visit and heard a message " Your call cannot be completed at this time". Unable to leave a message.  Xana Bradt,RN  1: 32 I called [facility] for her telephone visit and again heard the message " Your call cannot be completed at this time. " Again unable to leave a message.  I also called her contact number for [place] ( Mother) and also heard message " Your call cannot be completed at this time". Unable to leave a message.  Jahari Wiginton,RN  1:40 I called [facility] for her telephone visit and again heard the message " Your call cannot be completed at this time. " Again unable to leave a message. Will notify registar to reschedule. Daeshaun Specht,RN

## 2019-04-28 NOTE — Telephone Encounter (Signed)
Attempted to contact patient to get her rescheduled for her missed intake appointment. No answer, number stated that the call could could not be completed at this time. No show letter mailed.

## 2019-05-21 ENCOUNTER — Encounter: Payer: Medicaid Other | Admitting: Family Medicine

## 2019-05-25 ENCOUNTER — Inpatient Hospital Stay (HOSPITAL_COMMUNITY)
Admission: AD | Admit: 2019-05-25 | Discharge: 2019-05-25 | Discharge status: Against Medical Advice | Payer: Medicaid Other | Attending: Obstetrics & Gynecology | Admitting: Obstetrics & Gynecology

## 2019-05-25 ENCOUNTER — Encounter (HOSPITAL_COMMUNITY): Payer: Self-pay | Admitting: Obstetrics & Gynecology

## 2019-05-25 ENCOUNTER — Inpatient Hospital Stay (HOSPITAL_BASED_OUTPATIENT_CLINIC_OR_DEPARTMENT_OTHER): Payer: Medicaid Other

## 2019-05-25 ENCOUNTER — Other Ambulatory Visit: Payer: Self-pay

## 2019-05-25 DIAGNOSIS — O321XX0 Maternal care for breech presentation, not applicable or unspecified: Secondary | ICD-10-CM

## 2019-05-25 DIAGNOSIS — Z3A14 14 weeks gestation of pregnancy: Secondary | ICD-10-CM | POA: Diagnosis not present

## 2019-05-25 DIAGNOSIS — Z5329 Procedure and treatment not carried out because of patient's decision for other reasons: Secondary | ICD-10-CM | POA: Insufficient documentation

## 2019-05-25 DIAGNOSIS — O209 Hemorrhage in early pregnancy, unspecified: Secondary | ICD-10-CM | POA: Diagnosis present

## 2019-05-25 DIAGNOSIS — O4692 Antepartum hemorrhage, unspecified, second trimester: Secondary | ICD-10-CM

## 2019-05-25 LAB — CBC
HCT: 35.4 % — ABNORMAL LOW (ref 36.0–46.0)
Hemoglobin: 11.8 g/dL — ABNORMAL LOW (ref 12.0–15.0)
MCH: 29.9 pg (ref 26.0–34.0)
MCHC: 33.3 g/dL (ref 30.0–36.0)
MCV: 89.6 fL (ref 80.0–100.0)
Platelets: 198 10*3/uL (ref 150–400)
RBC: 3.95 MIL/uL (ref 3.87–5.11)
RDW: 14.5 % (ref 11.5–15.5)
WBC: 8 10*3/uL (ref 4.0–10.5)
nRBC: 0 % (ref 0.0–0.2)

## 2019-05-25 LAB — URINALYSIS, ROUTINE W REFLEX MICROSCOPIC
Bacteria, UA: NONE SEEN
Bilirubin Urine: NEGATIVE
Glucose, UA: NEGATIVE mg/dL
Hgb urine dipstick: NEGATIVE
Ketones, ur: 5 mg/dL — AB
Nitrite: NEGATIVE
Protein, ur: NEGATIVE mg/dL
Specific Gravity, Urine: 1.025 (ref 1.005–1.030)
pH: 6 (ref 5.0–8.0)

## 2019-05-25 LAB — WET PREP, GENITAL
Sperm: NONE SEEN
Trich, Wet Prep: NONE SEEN
Yeast Wet Prep HPF POC: NONE SEEN

## 2019-05-25 NOTE — MAU Note (Addendum)
Pt reports to [facility] with c/o an episode of bleeding earlier today when she went to the restroom.  Pt states she noticed brownish red bleeding in her underwear and has since seen spotting when using the restroom.  Pt denies recent intercourse or vag itching/dc.  Pt denies any pain at this time.

## 2019-05-25 NOTE — MAU Provider Note (Signed)
Patient Shelby Carlson is a 29 y.o. G1P0  at [redacted]w[redacted]d here with complaints of vaginal bleeding that started at 4 pm. She denies pelvic pain, pain with urination, abdominal pain or any other concerning signs or symptoms. She receives her prenatal care at [facility]. She has had an uncomplicated pregnancy thus far.    History     CSN: [id number]  Arrival date and time: 05/25/19 1659   First Provider Initiated Contact with Patient 05/25/19 1756      Chief Complaint  Patient presents with  . Vaginal Bleeding   Vaginal Bleeding The patient's primary symptoms include vaginal bleeding. This is a new problem. The current episode started today. The problem occurs intermittently. The problem has been resolved. The patient is experiencing no pain. Pertinent negatives include no back pain, constipation or vomiting. The vaginal discharge was bloody. The vaginal bleeding is spotting. She has not been passing clots. She has not been passing tissue.    OB History    Gravida  1   Para      Term      Preterm      AB      Living        SAB      TAB      Ectopic      Multiple      Live Births              Past Medical History:  Diagnosis Date  . Braxton Hick's contraction 04/04/2019  . Medical history non-contributory     Past Surgical History:  Procedure Laterality Date  . NO PAST SURGERIES      History reviewed. No pertinent family history.  Social History   Tobacco Use  . Smoking status: Never Smoker  . Smokeless tobacco: Never Used  Substance Use Topics  . Alcohol use: No  . Drug use: No    Allergies: No Known Allergies  No medications prior to admission.    Review of Systems  Constitutional: Negative.   HENT: Negative.   Respiratory: Negative.   Cardiovascular: Negative.   Gastrointestinal: Negative.  Negative for constipation and vomiting.  Genitourinary: Positive for vaginal bleeding.  Musculoskeletal: Negative for back pain.  Neurological:  Negative.    Physical Exam   Blood pressure 118/67, pulse 96, temperature 98.5 F (36.9 C), temperature source Oral, resp. rate 16, height 5\' 8"  (1.727 m), weight 60.3 kg, last menstrual period 01/17/2019, SpO2 99 %.  Physical Exam  Constitutional: She appears well-developed.  Eyes: Pupils are equal, round, and reactive to light.  Respiratory: Effort normal.  GI: Soft.  Genitourinary:    Genitourinary Comments: NEFG; no blood in the vagina, no discharge, cervix is long, closed and thick, no CMT, suprapubic or adnexal tenderness.    Musculoskeletal:        General: Normal range of motion.  Neurological: She is alert.  Skin: Skin is warm and dry.    MAU Course  Procedures  MDM -wet prep: positive for clue but no other complaints -GC :  -CBC: normal -ABO: pending -UA shows slight signs of dehydration.  -US shows no sign of placental abruption or [facility].   Assessment and Plan  -Patient wants to leave before ABO is returned; I explained to her that she will need to come back for an injection that is critical to the health of her children in the future; she agrees to come back if necessary.  -Patient will sign AMA paperwork.   Mervyn Skeeters Ardean Larsen  05/25/2019, 6:06 PM

## 2019-05-25 NOTE — MAU Note (Signed)
Pt was unable to wait for all results to come back because her ride was here. She requested AMA papers since she had no other way home. Pt signed the paper.

## 2019-05-26 LAB — ABO/RH: ABO/RH(D): A POS

## 2019-05-26 LAB — GC/CHLAMYDIA PROBE AMP (~~LOC~~) NOT AT ARMC
Chlamydia: NEGATIVE
Comment: NEGATIVE
Comment: NORMAL
Neisseria Gonorrhea: NEGATIVE

## 2020-06-29 ENCOUNTER — Ambulatory Visit (INDEPENDENT_AMBULATORY_CARE_PROVIDER_SITE_OTHER): Payer: Medicaid Other

## 2020-06-29 ENCOUNTER — Other Ambulatory Visit: Payer: Self-pay

## 2020-06-29 ENCOUNTER — Encounter (HOSPITAL_COMMUNITY): Payer: Self-pay | Admitting: Emergency Medicine

## 2020-06-29 ENCOUNTER — Ambulatory Visit (HOSPITAL_COMMUNITY)
Admission: EM | Admit: 2020-06-29 | Discharge: 2020-06-29 | Disposition: A | Discharge status: Home / Self Care | Payer: Medicaid Other

## 2020-06-29 DIAGNOSIS — M25531 Pain in right wrist: Secondary | ICD-10-CM | POA: Diagnosis not present

## 2020-06-29 DIAGNOSIS — W19XXXA Unspecified fall, initial encounter: Secondary | ICD-10-CM | POA: Diagnosis not present

## 2020-06-29 DIAGNOSIS — S60211A Contusion of right wrist, initial encounter: Secondary | ICD-10-CM | POA: Diagnosis not present

## 2020-06-29 DIAGNOSIS — M79643 Pain in unspecified hand: Secondary | ICD-10-CM

## 2020-06-29 NOTE — ED Triage Notes (Signed)
Pt presents today with right wrist pain after she fell off her porch this morning as she was rushing to get to work. No obvious deformity. +ROM

## 2020-06-29 NOTE — Discharge Instructions (Addendum)
-  Keep your splint on until you follow-up with orthopedist  -Call [facility] tomorrow to schedule this follow-up in about 7 days -For pain, Take Tylenol 1000 mg 3 times daily, and ibuprofen 800 mg 3 times daily with food.  You can take these together, or alternate every 3-4 hours. You can also try icing the hand. -Try to limit use of this hand until your evaluation by hand specialist. I provided a work note. If something hurts- try to avoid doing it.

## 2020-06-29 NOTE — ED Provider Notes (Signed)
[facility]    CSN: [id number] Arrival date & time: 06/29/20  1351      History   Chief Complaint Chief Complaint  Patient presents with  . Fall  . Wrist Pain    right    HPI Shelby Carlson is a 30 y.o. female presenting with right hand injury following fall.  States she tripped while leaving her house this morning and tumbled off the porch, landing onto her outstretched right hand.  She was carrying her baby with the other hand and so managed to avoid hurting baby.  States since then she has had significant pain of her right wrist and hand.  Some tingling in her fingertips.  Range of motion is limited due to pain.  Denies injury elsewhere.  Denies head trauma, loss of consciousness, headaches, dizziness, vision changes, shortness of breath, chest pain, abdominal pain. She is right handed.  HPI  Past Medical History:  Diagnosis Date  . Braxton Hick's contraction 04/04/2019  . Medical history non-contributory     There are no problems to display for this patient.   Past Surgical History:  Procedure Laterality Date  . NO PAST SURGERIES      OB History    Gravida  1   Para      Term      Preterm      AB      Living        SAB      IAB      Ectopic      Multiple      Live Births               Home Medications    Prior to Admission medications   Medication Sig Start Date End Date Taking? Authorizing Provider  acetaminophen (TYLENOL) 325 MG tablet Take 650 mg by mouth every 6 (six) hours as needed.   Yes [provider]    Family History History reviewed. No pertinent family history.  Social History Social History   Tobacco Use  . Smoking status: Never Smoker  . Smokeless tobacco: Never Used  Vaping Use  . Vaping Use: Former  Substance Use Topics  . Alcohol use: No  . Drug use: No     Allergies   Patient has no known allergies.   Review of Systems Review of Systems  Musculoskeletal:       R wrist and  hand pain  All other systems reviewed and are negative.    Physical Exam Triage Vital Signs ED Triage Vitals  Enc Vitals Group     BP      Pulse      Resp      Temp      Temp src      SpO2      Weight      Height      Head Circumference      Peak Flow      Pain Score      Pain Loc      Pain Edu?      Excl. in GC?    No data found.  Updated Vital Signs BP 114/72 (BP Location: Left Arm)   Pulse 63   Temp 98.2 F (36.8 C) (Oral)   Resp 16   SpO2 100%   Breastfeeding No   Visual Acuity Right Eye Distance:   Left Eye Distance:   Bilateral Distance:    Right Eye Near:   Left Eye Near:  Bilateral Near:     Physical Exam Vitals reviewed.  Constitutional:      General: She is not in acute distress.    Appearance: Normal appearance. She is not ill-appearing or diaphoretic.  HENT:     Head: Normocephalic and atraumatic.  Cardiovascular:     Rate and Rhythm: Normal rate and regular rhythm.     Heart sounds: Normal heart sounds.  Pulmonary:     Effort: Pulmonary effort is normal.     Breath sounds: Normal breath sounds.  Musculoskeletal:     Comments: R wrist diffusely tender over distal radius and ulna, worse over volar aspect. ROM wrist limited due to pain. No obvious bony deformity. ROM fingers intact. Sensation intact. Grip strength 3/5. Significant snuffbox tenderness. Radial pulse 2+.   Skin:    General: Skin is warm.  Neurological:     General: No focal deficit present.     Mental Status: She is alert and oriented to person, place, and time.  Psychiatric:        Mood and Affect: Mood normal.        Behavior: Behavior normal.        Thought Content: Thought content normal.        Judgment: Judgment normal.      UC Treatments / Results  Labs (all labs ordered are listed, but only abnormal results are displayed) Labs Reviewed - No data to display  EKG   Radiology DG Wrist Complete Right  Result Date: 06/29/2020 CLINICAL DATA:  Pain following  fall EXAM: RIGHT WRIST - COMPLETE 3+ VIEW COMPARISON:  None. FINDINGS: Frontal, oblique, lateral, and ulnar deviation scaphoid images were obtained. There is no fracture or dislocation. Joint spaces appear normal. No erosive change. IMPRESSION: No fracture or dislocation.  No evident arthropathy. Electronically Signed   By: Bretta Bang III M.D.   On: 06/29/2020 16:00    Procedures Procedures (including critical care time)  Medications Ordered in UC Medications - No data to display  Initial Impression / Assessment and Plan / UC Course  I have reviewed the triage vital signs and the nursing notes.  Pertinent labs & imaging results that were available during my care of the patient were reviewed by me and considered in my medical decision making (see chart for details).     This patient is a 30 year old female presenting with R right wrist and snuffbox tenderness following fall onto outstretched hand. Neurovascularly intact.   Xray R hand- No fracture or dislocation.  No evident arthropathy.  Given significant snuffbox tenderness, I am still placing this patient in a sugar tong splint and recommending f/u with hand specialist in about 7 days. Patient is in agreement with this treatment plan. ED return precautions discussed.  Final Clinical Impressions(s) / UC Diagnoses   Final diagnoses:  Tenderness of anatomical snuffbox  Contusion of right wrist, initial encounter  Fall, initial encounter     Discharge Instructions     -Keep your splint on until you follow-up with orthopedist  -Call [facility] tomorrow to schedule this follow-up in about 7 days -For pain, Take Tylenol 1000 mg 3 times daily, and ibuprofen 800 mg 3 times daily with food.  You can take these together, or alternate every 3-4 hours. You can also try icing the hand. -Try to limit use of this hand until your evaluation by hand specialist. I provided a work note. If something hurts- try to avoid doing it.     ED  Prescriptions  None     PDMP not reviewed this encounter.   Rhys Martini, PA-C 06/29/20 1654

## 2020-06-29 NOTE — Progress Notes (Signed)
Orthopedic Tech Progress Note Patient Details:  Shelby Carlson 1990-11-13 [id number]  Ortho Devices Type of Ortho Device: Arm sling,Sugartong splint Ortho Device/Splint Location: RUE Ortho Device/Splint Interventions: Ordered,Application   Post Interventions Patient Tolerated: Well Instructions Provided: Care of device   Donald Pore 06/29/2020, 5:15 PM

## 2020-06-29 NOTE — ED Notes (Signed)
Ortho called 

## 2020-11-21 ENCOUNTER — Encounter (HOSPITAL_COMMUNITY): Payer: Self-pay

## 2020-11-21 ENCOUNTER — Other Ambulatory Visit: Payer: Self-pay

## 2020-11-21 ENCOUNTER — Ambulatory Visit (HOSPITAL_COMMUNITY)
Admission: EM | Admit: 2020-11-21 | Discharge: 2020-11-21 | Disposition: A | Discharge status: Home / Self Care | Payer: Medicaid Other | Attending: Physician Assistant | Admitting: Physician Assistant

## 2020-11-21 DIAGNOSIS — Z20822 Contact with and (suspected) exposure to covid-19: Secondary | ICD-10-CM | POA: Insufficient documentation

## 2020-11-21 DIAGNOSIS — J069 Acute upper respiratory infection, unspecified: Secondary | ICD-10-CM | POA: Insufficient documentation

## 2020-11-21 DIAGNOSIS — G43009 Migraine without aura, not intractable, without status migrainosus: Secondary | ICD-10-CM | POA: Insufficient documentation

## 2020-11-21 LAB — SARS CORONAVIRUS 2 (TAT 6-24 HRS): SARS Coronavirus 2: NEGATIVE

## 2020-11-21 MED ORDER — ONDANSETRON 4 MG PO TBDP
ORAL_TABLET | ORAL | Status: AC
  Filled 2020-11-21: qty 1

## 2020-11-21 MED ORDER — ONDANSETRON 4 MG PO TBDP
4.0000 mg | ORAL_TABLET | Freq: Once | ORAL | Status: AC
Start: 2020-11-21 — End: 2020-11-21
  Administered 2020-11-21: 4 mg via ORAL

## 2020-11-21 MED ORDER — TIZANIDINE HCL 4 MG PO CAPS: 4.0000 mg | ORAL_CAPSULE | Freq: Three times a day (TID) | ORAL | 0 refills | Status: DC | PRN

## 2020-11-21 MED ORDER — KETOROLAC TROMETHAMINE 30 MG/ML IJ SOLN
30.0000 mg | Freq: Once | INTRAMUSCULAR | Status: AC
Start: 2020-11-21 — End: 2020-11-21
  Administered 2020-11-21: 30 mg via INTRAMUSCULAR

## 2020-11-21 MED ORDER — ONDANSETRON 4 MG PO TBDP: 4.0000 mg | ORAL_TABLET | Freq: Three times a day (TID) | ORAL | 0 refills | Status: DC | PRN

## 2020-11-21 MED ORDER — KETOROLAC TROMETHAMINE 30 MG/ML IJ SOLN
INTRAMUSCULAR | Status: AC
  Filled 2020-11-21: qty 1

## 2020-11-21 NOTE — ED Provider Notes (Signed)
[facility]    CSN: [id number] Arrival date & time: 11/21/20  1006      History   Chief Complaint Chief Complaint  Patient presents with   Headache   Generalized Body Aches    HPI Shelby Carlson is a 30 y.o. female.   Patient presents today with a 3-day history of URI symptoms that have resulted in severe headache.  She reports congestion, sore throat, cough, headache, nausea.  Denies fever, chest pain, shortness of breath, vomiting, abdominal pain.  Denies any known sick contacts but did recently have a birthday party with several kids that had cold symptoms.  She has tried Tylenol and TheraFlu without improvement of symptoms.  Her primary concern today severe headache.  Reports that he pain is currently 6 on a 0-10 pain scale, localized to right hemisphere, described as throbbing, worse behind her eye, worse with light and sound.  She has no history of migraines.  Denies any aura or visual changes.  Denies any recent head injury, medication changes.  She denies any dysarthria, focal weakness.  Does not describe this as the worst headache of her life.  She is confident that she is not pregnant.   Past Medical History:  Diagnosis Date   Braxton Hick's contraction 04/04/2019   Medical history non-contributory     There are no problems to display for this patient.   Past Surgical History:  Procedure Laterality Date   NO PAST SURGERIES      OB History     Gravida  1   Para      Term      Preterm      AB      Living         SAB      IAB      Ectopic      Multiple      Live Births               Home Medications    Prior to Admission medications   Medication Sig Start Date End Date Taking? Authorizing Provider  ondansetron (ZOFRAN ODT) 4 MG disintegrating tablet Take 1 tablet (4 mg total) by mouth every 8 (eight) hours as needed for nausea or vomiting. 11/21/20  Yes Arella Blinder K, PA-C  tiZANidine (ZANAFLEX) 4 MG capsule Take 1 capsule  (4 mg total) by mouth 3 (three) times daily as needed for muscle spasms. 11/21/20  Yes Galo Sayed, Noberto Retort, PA-C  acetaminophen (TYLENOL) 325 MG tablet Take 650 mg by mouth every 6 (six) hours as needed.    [provider]    Family History History reviewed. No pertinent family history.  Social History Social History   Tobacco Use   Smoking status: Never   Smokeless tobacco: Never  Vaping Use   Vaping Use: Former  Substance Use Topics   Alcohol use: No   Drug use: No     Allergies   Patient has no known allergies.   Review of Systems Review of Systems  Constitutional:  Positive for activity change. Negative for appetite change, fatigue and fever.  HENT:  Positive for congestion and sore throat. Negative for sinus pressure and sneezing.   Eyes:  Positive for photophobia. Negative for visual disturbance.  Respiratory:  Positive for cough. Negative for shortness of breath.   Cardiovascular:  Negative for chest pain.  Gastrointestinal:  Positive for nausea. Negative for abdominal pain, diarrhea and vomiting.  Musculoskeletal:  Negative for arthralgias and myalgias.  Neurological:  Positive for headaches. Negative for dizziness and light-headedness.    Physical Exam Triage Vital Signs ED Triage Vitals  Enc Vitals Group     BP 11/21/20 1026 124/79     Pulse Rate 11/21/20 1026 64     Resp 11/21/20 1026 17     Temp 11/21/20 1026 98.2 F (36.8 C)     Temp Source 11/21/20 1026 Oral     SpO2 11/21/20 1026 98 %     Weight --      Height --      Head Circumference --      Peak Flow --      Pain Score 11/21/20 1025 4     Pain Loc --      Pain Edu? --      Excl. in GC? --    No data found.  Updated Vital Signs BP 124/79 (BP Location: Right Arm)   Pulse 64   Temp 98.2 F (36.8 C) (Oral)   Resp 17   LMP 11/18/2020 (Exact Date)   SpO2 98%   Visual Acuity Right Eye Distance:   Left Eye Distance:   Bilateral Distance:    Right Eye Near:   Left Eye Near:     Bilateral Near:     Physical Exam Vitals reviewed.  Constitutional:      General: She is awake. She is not in acute distress.    Appearance: Normal appearance. She is well-developed. She is not ill-appearing.     Comments: Very pleasant female appears stated age no acute distress sitting comfortably in exam room  HENT:     Head: Normocephalic and atraumatic. No raccoon eyes, Battle's sign or contusion.     Right Ear: Tympanic membrane, ear canal and external ear normal. No hemotympanum.     Left Ear: Tympanic membrane, ear canal and external ear normal. No hemotympanum.     Nose: Nose normal.     Mouth/Throat:     Tongue: Tongue does not deviate from midline.     Pharynx: Uvula midline. Posterior oropharyngeal erythema present. No oropharyngeal exudate.  Eyes:     Extraocular Movements: Extraocular movements intact.     Conjunctiva/sclera: Conjunctivae normal.     Pupils: Pupils are equal, round, and reactive to light.  Cardiovascular:     Rate and Rhythm: Normal rate and regular rhythm.     Heart sounds: Normal heart sounds, S1 normal and S2 normal. No murmur heard. Pulmonary:     Effort: Pulmonary effort is normal.     Breath sounds: Normal breath sounds. No wheezing, rhonchi or rales.     Comments: Clear to auscultation bilaterally Abdominal:     General: Bowel sounds are normal.     Palpations: Abdomen is soft.     Tenderness: There is no abdominal tenderness.  Musculoskeletal:     Cervical back: Normal range of motion and neck supple. No spinous process tenderness or muscular tenderness.     Comments: Strength 5/5 bilateral upper and lower extremities  Lymphadenopathy:     Head:     Right side of head: No submental, submandibular or tonsillar adenopathy.     Left side of head: No submental, submandibular or tonsillar adenopathy.  Neurological:     General: No focal deficit present.     Cranial Nerves: Cranial nerves are intact.     Motor: Motor function is intact.      Coordination: Coordination is intact.     Gait: Gait is intact.  Comments: Cranial nerves II through XII intact.  Psychiatric:        Behavior: Behavior is cooperative.     UC Treatments / Results  Labs (all labs ordered are listed, but only abnormal results are displayed) Labs Reviewed  SARS CORONAVIRUS 2 (TAT 6-24 HRS)    EKG   Radiology No results found.  Procedures Procedures (including critical care time)  Medications Ordered in UC Medications  ketorolac (TORADOL) 30 MG/ML injection 30 mg (30 mg Intramuscular Given 11/21/20 1053)  ondansetron (ZOFRAN-ODT) disintegrating tablet 4 mg (4 mg Oral Given 11/21/20 1052)    Initial Impression / Assessment and Plan / UC Course  I have reviewed the triage vital signs and the nursing notes.  Pertinent labs & imaging results that were available during my care of the patient were reviewed by me and considered in my medical decision making (see chart for details).      Vital signs and physical exam reassuring today; no indication for emergent evaluation or imaging.  Suspect that URI caused migraine.  Patient was tested for COVID-19-results pending.  She was instructed to remain in isolation until results are obtained and was given work excuse note with current CDC return to work guidelines.  She was given Toradol and Zofran in clinic today with improvement but not resolution of symptoms.  She was prescribed Zofran to be used up to 3 times a day as needed for nausea and vomiting symptoms.  Prescribed Zanaflex to be used up to 3 times a day to help break headache cycle with instruction to drive drink alcohol taking this.  She can use Tylenol and over-the-counter medications for additional symptom relief.  Recommended rest and drinking plenty of fluid.  Discussed at length alarm symptoms that warrant emergent evaluation.  Strict return precautions given to which she expressed understanding.  Final Clinical Impressions(s) / UC Diagnoses    Final diagnoses:  Upper respiratory tract infection, unspecified type  Migraine without aura and without status migrainosus, not intractable     Discharge Instructions      Suspect that you had a virus that has caused her to have a migraine.  We will contact you if you are positive for COVID-19.  Please remain in isolation until you get your results.  You can continue Tylenol for pain relief.  Please hold off on taking any ibuprofen or other NSAIDs for 24 hours as we gave you a shot of Toradol today.  Use Zofran up to 3 times a day as needed for nausea.  You can use Zanaflex up to 3 times a day as needed.  This make you sleepy so not drive drink alcohol while taking it.  If your symptoms are not improving or if anything worsens you need to be reevaluated.  Develop any severe headache (worst of your life), nausea/vomiting interfering with your oral intake, vision changes, chest pain, shortness of breath, weakness, difficulty speaking you need to go to the hospital.     ED Prescriptions     Medication Sig Dispense Auth. Provider   tiZANidine (ZANAFLEX) 4 MG capsule Take 1 capsule (4 mg total) by mouth 3 (three) times daily as needed for muscle spasms. 21 capsule Tashima Scarpulla K, PA-C   ondansetron (ZOFRAN ODT) 4 MG disintegrating tablet Take 1 tablet (4 mg total) by mouth every 8 (eight) hours as needed for nausea or vomiting. 20 tablet Blessing Ozga, Noberto Retort, PA-C      PDMP not reviewed this encounter.   Takeria Marquina, Noberto Retort,  PA-C 11/21/20 1135

## 2020-11-21 NOTE — ED Triage Notes (Signed)
Pt presents with c/o headache x 3 days. States her body aches and states she has taken TheraFlu and Tylenol, states it has not given her relief. Pt states being in the sun causes her a headache.

## 2020-11-21 NOTE — Discharge Instructions (Addendum)
Suspect that you had a virus that has caused her to have a migraine.  We will contact you if you are positive for COVID-19.  Please remain in isolation until you get your results.  You can continue Tylenol for pain relief.  Please hold off on taking any ibuprofen or other NSAIDs for 24 hours as we gave you a shot of Toradol today.  Use Zofran up to 3 times a day as needed for nausea.  You can use Zanaflex up to 3 times a day as needed.  This make you sleepy so not drive drink alcohol while taking it.  If your symptoms are not improving or if anything worsens you need to be reevaluated.  Develop any severe headache (worst of your life), nausea/vomiting interfering with your oral intake, vision changes, chest pain, shortness of breath, weakness, difficulty speaking you need to go to the hospital.

## 2021-05-09 ENCOUNTER — Ambulatory Visit (HOSPITAL_COMMUNITY)
Admission: EM | Admit: 2021-05-09 | Discharge: 2021-05-09 | Discharge status: Against Medical Advice | Payer: Medicaid Other

## 2021-05-09 ENCOUNTER — Ambulatory Visit (INDEPENDENT_AMBULATORY_CARE_PROVIDER_SITE_OTHER): Payer: Medicaid Other

## 2021-05-09 ENCOUNTER — Encounter (HOSPITAL_COMMUNITY): Payer: Self-pay | Admitting: *Deleted

## 2021-05-09 ENCOUNTER — Other Ambulatory Visit: Payer: Self-pay

## 2021-05-09 ENCOUNTER — Ambulatory Visit (HOSPITAL_COMMUNITY)
Admission: EM | Admit: 2021-05-09 | Discharge: 2021-05-09 | Disposition: A | Discharge status: Home / Self Care | Payer: Medicaid Other | Attending: Internal Medicine | Admitting: Internal Medicine

## 2021-05-09 DIAGNOSIS — S62306A Unspecified fracture of fifth metacarpal bone, right hand, initial encounter for closed fracture: Secondary | ICD-10-CM

## 2021-05-09 DIAGNOSIS — W19XXXA Unspecified fall, initial encounter: Secondary | ICD-10-CM

## 2021-05-09 DIAGNOSIS — Z23 Encounter for immunization: Secondary | ICD-10-CM | POA: Diagnosis not present

## 2021-05-09 DIAGNOSIS — S62308A Unspecified fracture of other metacarpal bone, initial encounter for closed fracture: Secondary | ICD-10-CM | POA: Diagnosis not present

## 2021-05-09 MED ORDER — TETANUS-DIPHTH-ACELL PERTUSSIS 5-2.5-18.5 LF-MCG/0.5 IM SUSY
0.5000 mL | PREFILLED_SYRINGE | Freq: Once | INTRAMUSCULAR | Status: AC
  Administered 2021-05-09: 0.5 mL via INTRAMUSCULAR

## 2021-05-09 MED ORDER — TETANUS-DIPHTH-ACELL PERTUSSIS 5-2.5-18.5 LF-MCG/0.5 IM SUSY
PREFILLED_SYRINGE | INTRAMUSCULAR | Status: AC
  Filled 2021-05-09: qty 0.5

## 2021-05-09 NOTE — ED Triage Notes (Signed)
Pt reports fall on Sunday morning after drinking. Pt's Rt hand is swollen. ?

## 2021-05-09 NOTE — Progress Notes (Signed)
Orthopedic Tech Progress Note ?Patient Details:  ?Shelby Carlson ?[birth year] ?[id number] ? ?Ortho Devices ?Type of Ortho Device: Ulna gutter splint, Cotton web roll ?Ortho Device/Splint Location: RUE ?Ortho Device/Splint Interventions: Ordered, Application, Adjustment ?  ?Post Interventions ?Patient Tolerated: Well ?Instructions Provided: Care of device ? ?Janit Pagan ?05/09/2021, 7:17 PM ? ?

## 2021-05-09 NOTE — Discharge Instructions (Signed)
Take 2 tablets of Keflex in the morning and 2 tablets at night. ?Please make follow-up appointment with Dr. Freida Busman. ?You can continue to take Tylenol and ibuprofen alternating for pain. ?

## 2021-05-09 NOTE — ED Provider Notes (Signed)
?[facility] ? ?____________________________________________ ? ?Time seen: Approximately 6:39 PM ? ?I have reviewed the triage vital signs and the nursing notes. ? ? ?HISTORY ? ?Chief Complaint ?Fall and Hand Injury ? ? ?Historian ?Patient  ? ? ? ?HPI ?Shelby Carlson is a 31 y.o. female presents to the urgent care with dorsal hand swelling.  Patient states that she had a mechanical fall while intoxicated several days ago and swelling seems to be worsening.  She denies fever or chills.  No similar injuries in the past. ? ? ?Past Medical History:  ?Diagnosis Date  ? Braxton Hick's contraction 04/04/2019  ? Medical history non-contributory   ? ? ? ?Immunizations up to date:  Yes.   ? ? ?Past Medical History:  ?Diagnosis Date  ? Braxton Hick's contraction 04/04/2019  ? Medical history non-contributory   ? ? ?There are no problems to display for this patient. ? ? ?Past Surgical History:  ?Procedure Laterality Date  ? NO PAST SURGERIES    ? ? ?Prior to Admission medications   ?Medication Sig Start Date End Date Taking? Authorizing Provider  ?acetaminophen (TYLENOL) 325 MG tablet Take 650 mg by mouth every 6 (six) hours as needed.    [provider]  ?ondansetron (ZOFRAN ODT) 4 MG disintegrating tablet Take 1 tablet (4 mg total) by mouth every 8 (eight) hours as needed for nausea or vomiting. 11/21/20   Raspet, Denny Peon K, PA-C  ?tiZANidine (ZANAFLEX) 4 MG capsule Take 1 capsule (4 mg total) by mouth 3 (three) times daily as needed for muscle spasms. 11/21/20   Raspet, Noberto Retort, PA-C  ? ? ?Allergies ?Patient has no known allergies. ? ?History reviewed. No pertinent family history. ? ?Social History ?Social History  ? ?Tobacco Use  ? Smoking status: Never  ? Smokeless tobacco: Never  ?Vaping Use  ? Vaping Use: Former  ?Substance Use Topics  ? Alcohol use: No  ? Drug use: No  ? ? ? ?Review of Systems  ?Constitutional: No fever/chills ?Eyes:  No discharge ?ENT: No upper respiratory complaints. ?Respiratory: no  cough. No SOB/ use of accessory muscles to breath ?Gastrointestinal:   No nausea, no vomiting.  No diarrhea.  No constipation. ?Musculoskeletal: Patient has right hand pain.  ?Skin: Negative for rash, abrasions, lacerations, ecchymosis. ? ? ? ?____________________________________________ ? ? ?PHYSICAL EXAM: ? ?VITAL SIGNS: ?ED Triage Vitals  ?Enc Vitals Group  ?   BP 05/09/21 1806 131/75  ?   Pulse Rate 05/09/21 1806 77  ?   Resp 05/09/21 1806 20  ?   Temp 05/09/21 1806 98 ?F (36.7 ?C)  ?   Temp src --   ?   SpO2 05/09/21 1806 100 %  ?   Weight --   ?   Height --   ?   Head Circumference --   ?   Peak Flow --   ?   Pain Score 05/09/21 1804 10  ?   Pain Loc --   ?   Pain Edu? --   ?   Excl. in GC? --   ? ? ? ?Constitutional: Alert and oriented. Well appearing and in no acute distress. ?Eyes: Conjunctivae are normal. PERRL. EOMI. ?Head: Atraumatic. ?ENT: ?Cardiovascular: Normal rate, regular rhythm. Normal S1 and S2.  Good peripheral circulation. ?Respiratory: Normal respiratory effort without tachypnea or retractions. Lungs CTAB. Good air entry to the bases with no decreased or absent breath sounds ?Gastrointestinal: Bowel sounds x 4 quadrants. Soft and nontender to palpation. No guarding or  rigidity. No distention. ?Musculoskeletal: Patient has symmetric grip strength.  Patient has swelling along the dorsal right hand with several abrasions.  She is able to move all 5 right fingers.  Palpable radial ulnar pulses bilaterally and symmetrically.  Capillary refill less than 2 seconds on the right. ?Neurologic:  Normal for age. No gross focal neurologic deficits are appreciated.  ?Skin:  Skin is warm, dry and intact. No rash noted. ?Psychiatric: Mood and affect are normal for age. Speech and behavior are normal.  ? ?____________________________________________ ?  ?LABS ?(all labs ordered are listed, but only abnormal results are displayed) ? ?Labs Reviewed - No data to  display ?____________________________________________ ? ?EKG ? ? ?____________________________________________ ? ?RADIOLOGY ?Geraldo Pitter, personally viewed and evaluated these images (plain radiographs) as part of my medical decision making, as well as reviewing the written report by the radiologist. ? ?DG Hand Complete Right ? ?Result Date: 05/09/2021 ?CLINICAL DATA:  Larey Seat, right hand swelling and bruising EXAM: RIGHT HAND - COMPLETE 3+ VIEW COMPARISON:  06/29/2020 FINDINGS: Frontal, oblique, lateral views of the right hand are obtained. There is a comminuted impacted extra-articular fracture involving the distal aspect of the fifth metacarpal, with dorsal angulation at the fracture site. Marked overlying soft tissue swelling. No other acute displaced fractures. Joint spaces are well preserved. IMPRESSION: 1. Comminuted impacted fracture of the distal aspect fifth metacarpal, with dorsal angulation at the fracture site. 2. Dorsal soft tissue swelling. Electronically Signed   By: Sharlet Salina M.D.   On: 05/09/2021 18:28   ? ?____________________________________________ ? ? ? ?PROCEDURES ? ?Procedure(s) performed:  ? ? ? ?Procedures ? ? ? ? ?Medications  ?Tdap (BOOSTRIX) injection 0.5 mL (has no administration in time range)  ? ? ? ?____________________________________________ ? ? ?INITIAL IMPRESSION / ASSESSMENT AND PLAN / ED COURSE ? ?Pertinent labs & imaging results that were available during my care of the patient were reviewed by me and considered in my medical decision making (see chart for details). ? ?  ?Assessment and plan ?Hand pain ? ?31 year old female presents to the urgent care with dorsal hand swelling with multiple abrasions. ? ?Vital signs are reassuring at triage.  On physical exam, patient has a right fifth metacarpal fracture.  Patient was placed in an ulnar gutter splint and Keflex was prescribed as patient has multiple regions of skin compromise.  She was referred to orthopedics, Dr.  Rogelia Rohrer and a work note was provided. ? ? ? ?____________________________________________ ? ?FINAL CLINICAL IMPRESSION(S) / ED DIAGNOSES ? ?Final diagnoses:  ?None  ? ? ? ? ?NEW MEDICATIONS STARTED DURING THIS VISIT: ? ?ED Discharge Orders   ? ? None  ? ?  ? ? ? ? ? ? ?This chart was dictated using voice recognition software/Dragon. Despite best efforts to proofread, errors can occur which can change the meaning. Any change was purely unintentional. ? ? ?  ?Pia Mau Bassett, PA-C ?05/09/21 1935 ? ?

## 2021-12-21 IMAGING — DX DG WRIST COMPLETE 3+V*R*
4 series · 4 of 4 positions shown · non-contrast
Comparison: None.

CLINICAL DATA: Pain following fall

EXAM:
RIGHT WRIST - COMPLETE 3+ VIEW

[wrist pa]
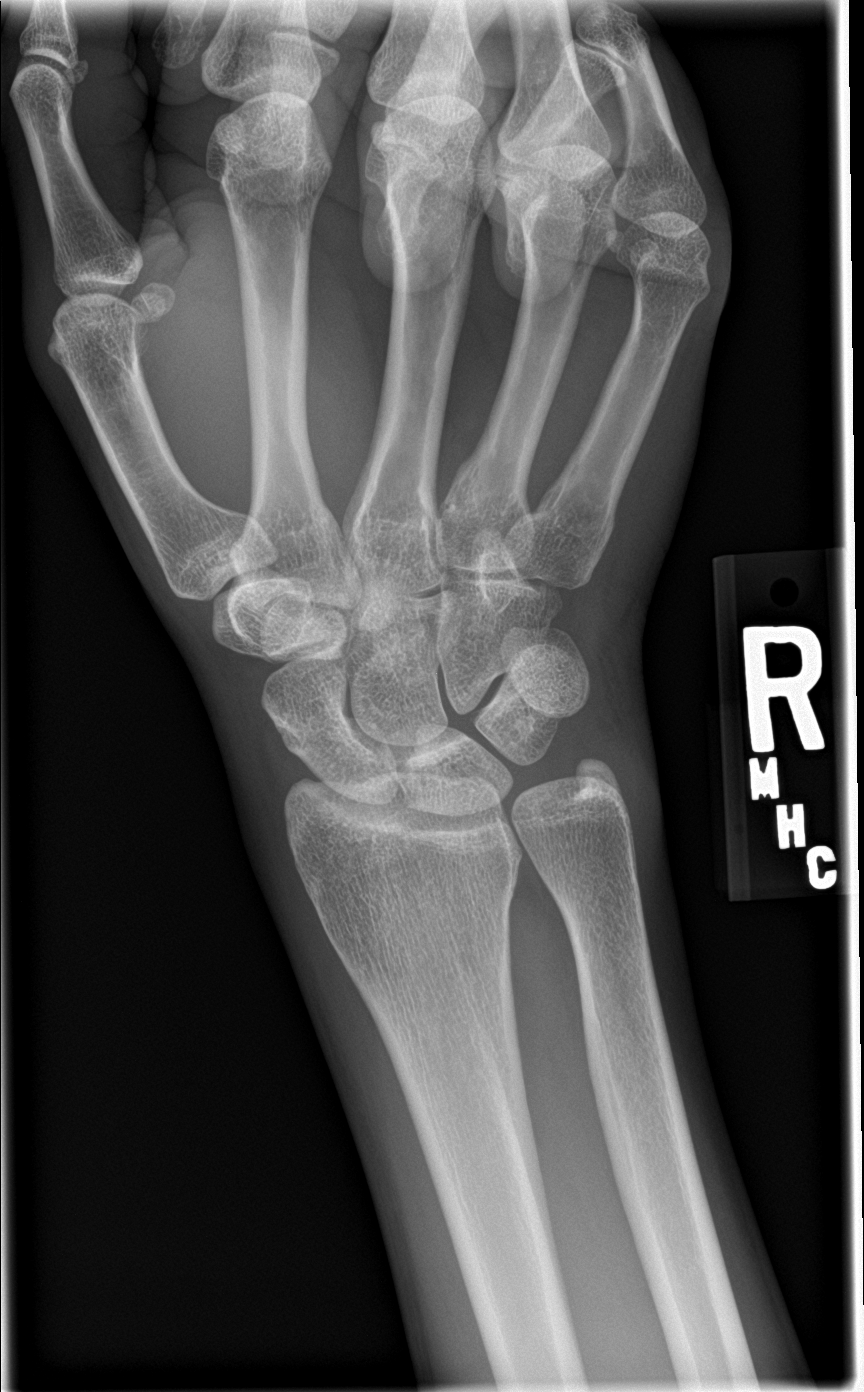

[wrist navicular]
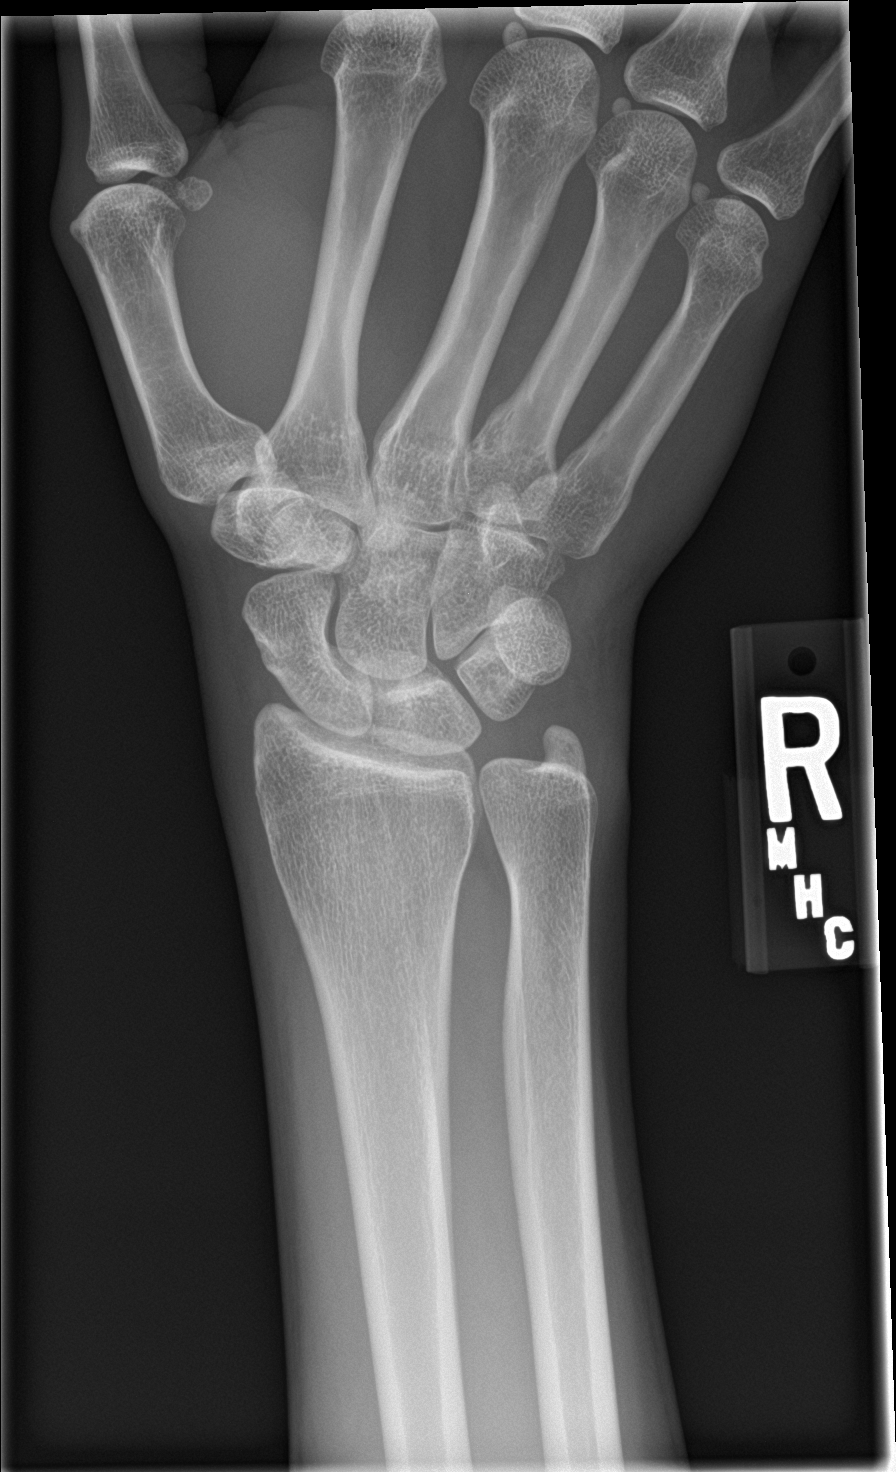

[wrist obl]
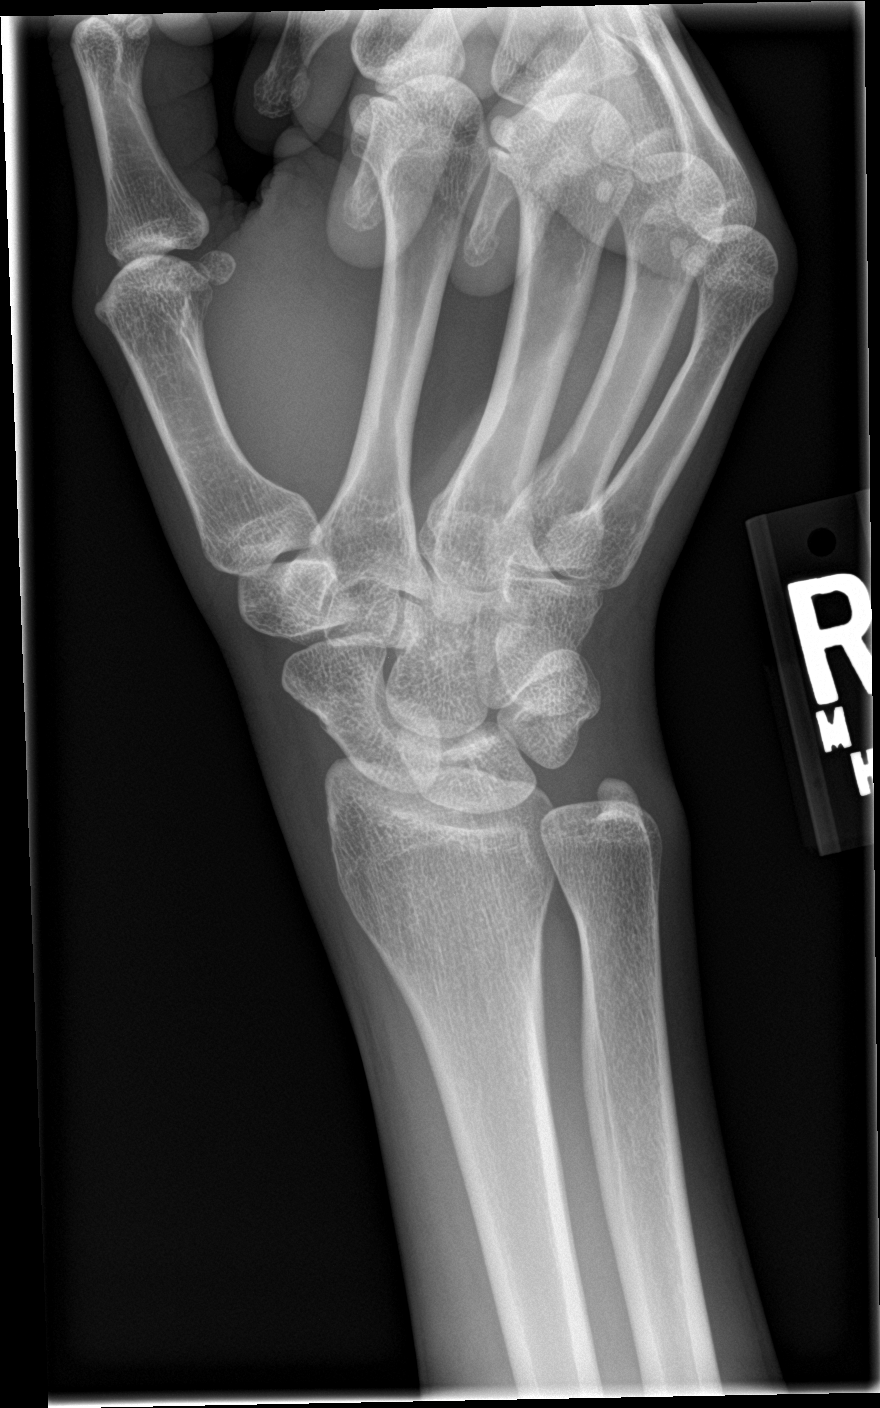

[wrist lat]
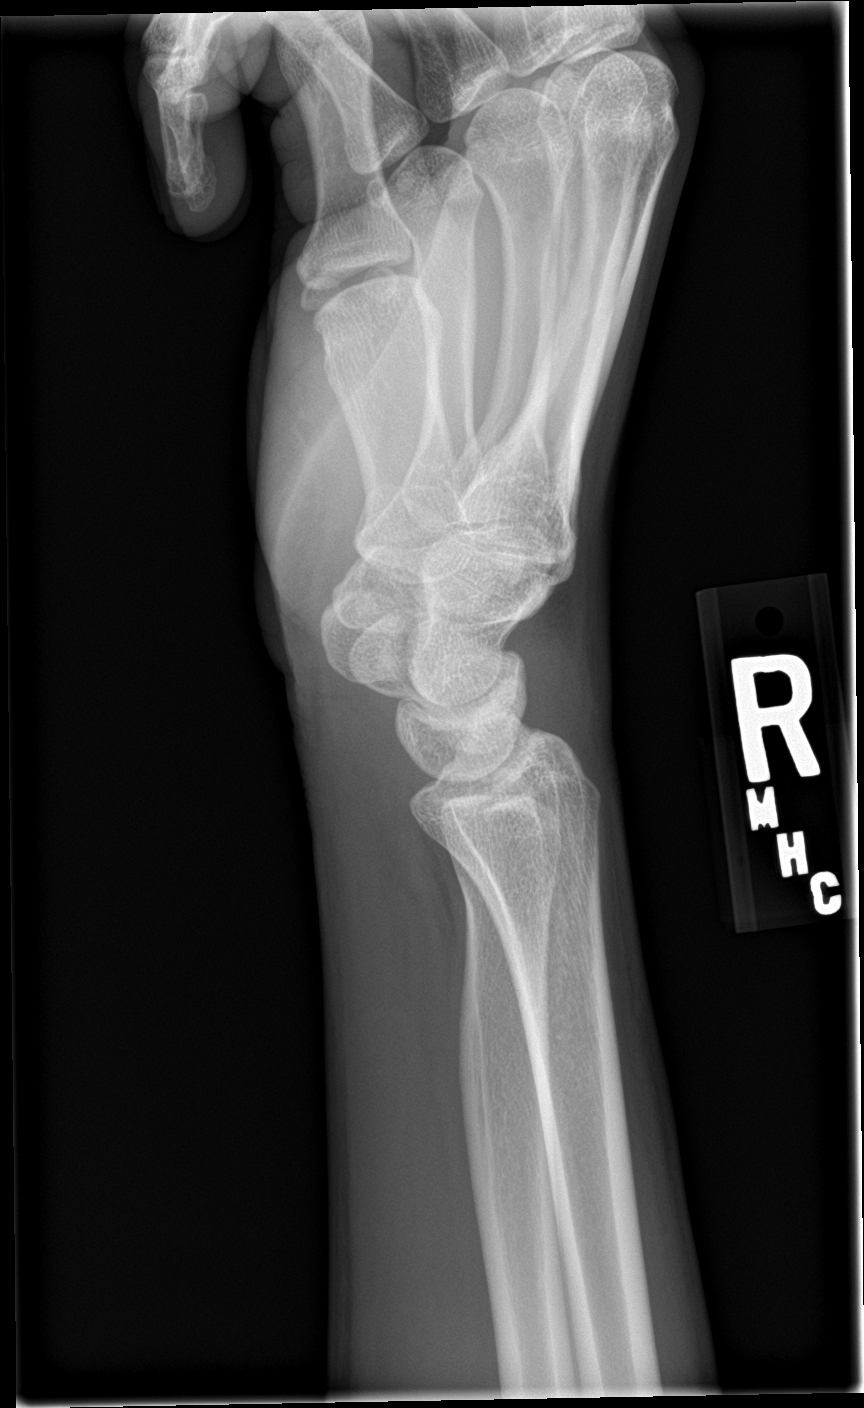

[4 of 4 positions shown; findings below may reference images not displayed]

FINDINGS: Frontal, oblique, lateral, and ulnar deviation scaphoid images were
obtained. There is no fracture or dislocation. Joint spaces appear
normal. No erosive change.
IMPRESSION: No fracture or dislocation.  No evident arthropathy.

## 2022-07-12 ENCOUNTER — Encounter (HOSPITAL_COMMUNITY): Payer: Self-pay

## 2022-07-12 ENCOUNTER — Ambulatory Visit (HOSPITAL_COMMUNITY)
Admission: EM | Admit: 2022-07-12 | Discharge: 2022-07-12 | Disposition: A | Discharge status: Home / Self Care | Payer: Medicaid Other | Attending: Emergency Medicine | Admitting: Emergency Medicine

## 2022-07-12 ENCOUNTER — Ambulatory Visit (INDEPENDENT_AMBULATORY_CARE_PROVIDER_SITE_OTHER): Payer: Medicaid Other

## 2022-07-12 DIAGNOSIS — M436 Torticollis: Secondary | ICD-10-CM

## 2022-07-12 MED ORDER — METHYLPREDNISOLONE SODIUM SUCC 125 MG IJ SOLR
80.0000 mg | Freq: Once | INTRAMUSCULAR | Status: AC
  Administered 2022-07-12: 80 mg via INTRAMUSCULAR

## 2022-07-12 MED ORDER — PREDNISONE 20 MG PO TABS: 40.0000 mg | ORAL_TABLET | Freq: Every day | ORAL | 0 refills | Status: AC

## 2022-07-12 MED ORDER — CYCLOBENZAPRINE HCL 10 MG PO TABS: 10.0000 mg | ORAL_TABLET | Freq: Two times a day (BID) | ORAL | 0 refills | Status: AC | PRN

## 2022-07-12 MED ORDER — NAPROXEN 500 MG PO TABS: 500.0000 mg | ORAL_TABLET | Freq: Two times a day (BID) | ORAL | 0 refills | Status: AC

## 2022-07-12 MED ORDER — METHYLPREDNISOLONE SODIUM SUCC 125 MG IJ SOLR
INTRAMUSCULAR | Status: AC
  Filled 2022-07-12: qty 2

## 2022-07-12 NOTE — Discharge Instructions (Addendum)
You appear to be having a muscle spasm of your neck.  Your imaging showed your muscle stiffness, it was negative for fracture or dislocation. We have given you a steroid injection in clinic.  Please take the anti-inflammatory naproxen twice daily to help with pain relief as well as the muscle relaxer Flexeril.  Do not drink or drive on the muscle relaxer as it may make you drowsy.  Also please start the steroids tomorrow daily with breakfast for the next 5 days to help with the inflammation.  Please seek immediate care if you develop numbness, tingling, one-sided weakness, or any new concerning symptoms.  If your symptoms persist, I suggest following up with orthopedic.

## 2022-07-12 NOTE — ED Provider Notes (Signed)
[facility]    CSN: [id number] Arrival date & time: 07/12/22  1833      History   Chief Complaint Chief Complaint  Patient presents with   neck stiffness    HPI Shelby Carlson is a 32 y.o. female.   Patient reports she woke up on Sunday and she was fine, throughout the afternoon she noticed that she had some left-sided neck stiffness.  Reports Monday it was much much worse and she was unable to sit up this morning due to the pain and stiffness.  She denies any falls or trauma.  Denies any neck surgeries.  Denies any numbness or tingling.  She has tried massage and a tramadol with only minor relief.  She reports her neck stiffness has improved throughout the day today, however, it is still quite painful.  Reports she is unable to fully turn her neck.  She denies fever.  The history is provided by the patient and medical records.    Past Medical History:  Diagnosis Date   Braxton Hick's contraction 04/04/2019   Medical history non-contributory     There are no problems to display for this patient.   Past Surgical History:  Procedure Laterality Date   NO PAST SURGERIES      OB History     Gravida  1   Para      Term      Preterm      AB      Living         SAB      IAB      Ectopic      Multiple      Live Births               Home Medications    Prior to Admission medications   Medication Sig Start Date End Date Taking? Authorizing Provider  cyclobenzaprine (FLEXERIL) 10 MG tablet Take 1 tablet (10 mg total) by mouth 2 (two) times daily as needed for muscle spasms. 07/12/22  Yes Rinaldo Ratel, [place] N, FNP  naproxen (NAPROSYN) 500 MG tablet Take 1 tablet (500 mg total) by mouth 2 (two) times daily. 07/12/22  Yes Rinaldo Ratel, [place] N, FNP  predniSONE (DELTASONE) 20 MG tablet Take 2 tablets (40 mg total) by mouth daily for 5 days. 07/12/22 07/17/22 Yes Rinaldo Ratel, [place] N, FNP  acetaminophen (TYLENOL) 325 MG tablet Take 650 mg by mouth  every 6 (six) hours as needed.    [provider]    Family History History reviewed. No pertinent family history.  Social History Social History   Tobacco Use   Smoking status: Never   Smokeless tobacco: Never  Vaping Use   Vaping Use: Former  Substance Use Topics   Alcohol use: No   Drug use: No     Allergies   Patient has no known allergies.   Review of Systems Review of Systems  Constitutional:  Negative for chills and fever.  Musculoskeletal:  Positive for neck pain and neck stiffness.     Physical Exam Triage Vital Signs ED Triage Vitals  Enc Vitals Group     BP 07/12/22 1922 126/88     Pulse Rate 07/12/22 1922 67     Resp 07/12/22 1922 14     Temp 07/12/22 1922 98 F (36.7 C)     Temp Source 07/12/22 1922 Oral     SpO2 07/12/22 1922 98 %     Weight --      Height --  Head Circumference --      Peak Flow --      Pain Score 07/12/22 1924 7     Pain Loc --      Pain Edu? --      Excl. in GC? --    No data found.  Updated Vital Signs BP 126/88 (BP Location: Left Arm)   Pulse 67   Temp 98 F (36.7 C) (Oral)   Resp 14   SpO2 98%   Visual Acuity Right Eye Distance:   Left Eye Distance:   Bilateral Distance:    Right Eye Near:   Left Eye Near:    Bilateral Near:     Physical Exam Vitals and nursing note reviewed.  Constitutional:      Appearance: Normal appearance.  HENT:     Head: Normocephalic and atraumatic.     Right Ear: External ear normal.     Left Ear: External ear normal.     Nose: Nose normal.     Mouth/Throat:     Mouth: Mucous membranes are moist.  Eyes:     Conjunctiva/sclera: Conjunctivae normal.  Cardiovascular:     Rate and Rhythm: Normal rate.  Pulmonary:     Effort: Pulmonary effort is normal. No respiratory distress.  Musculoskeletal:        General: Tenderness present. No swelling, deformity or signs of injury.     Cervical back: Tenderness present.  Skin:    General: Skin is warm and dry.   Neurological:     General: No focal deficit present.     Mental Status: She is alert and oriented to person, place, and time.  Psychiatric:        Mood and Affect: Mood normal.        Behavior: Behavior normal. Behavior is cooperative.      UC Treatments / Results  Labs (all labs ordered are listed, but only abnormal results are displayed) Labs Reviewed - No data to display  EKG   Radiology DG Cervical Spine 2-3 Views  Result Date: 07/12/2022 CLINICAL DATA:  pain, neck stiffness EXAM: CERVICAL SPINE - 2-3 VIEW COMPARISON:  None Available. FINDINGS: There is no evidence of cervical spine fracture or prevertebral soft tissue swelling. Alignment is normal. No other significant bone abnormalities are identified. IMPRESSION: Negative cervical spine radiographs. Electronically Signed   By: Tish Frederickson M.D.   On: 07/12/2022 20:02    Procedures Procedures (including critical care time)  Medications Ordered in UC Medications  methylPREDNISolone sodium succinate (SOLU-MEDROL) 125 mg/2 mL injection 80 mg (80 mg Intramuscular Given 07/12/22 2014)    Initial Impression / Assessment and Plan / UC Course  I have reviewed the triage vital signs and the nursing notes.  Pertinent labs & imaging results that were available during my care of the patient were reviewed by me and considered in my medical decision making (see chart for details).  Vitals and triage reviewed, patient is hemodynamically stable.  Significant left-sided cervical neck spasm on physical exam, without red flag symptoms of weakness, numbness or tingling.  No injury or trauma.  Imaging negative for fracture or herniated disc.  Suspect torticollis. Given IM steroid in clinic for pain and inflammation.  Sent home on naproxen and Flexeril, steroid burst.  Given information for orthopedic follow-up if symptoms persist.  Red flag symptoms discussed.  Plan of care, follow-up care and return precautions reviewed, no questions at  this time.     Final Clinical Impressions(s) / UC Diagnoses  Final diagnoses:  Torticollis, acute     Discharge Instructions      You appear to be having a muscle spasm of your neck.  Your imaging showed your muscle stiffness, it was negative for fracture or dislocation. We have given you a steroid injection in clinic.  Please take the anti-inflammatory naproxen twice daily to help with pain relief as well as the muscle relaxer Flexeril.  Do not drink or drive on the muscle relaxer as it may make you drowsy.  Also please start the steroids tomorrow daily with breakfast for the next 5 days to help with the inflammation.  Please seek immediate care if you develop numbness, tingling, one-sided weakness, or any new concerning symptoms.  If your symptoms persist, I suggest following up with orthopedic.      ED Prescriptions     Medication Sig Dispense Auth. Provider   cyclobenzaprine (FLEXERIL) 10 MG tablet Take 1 tablet (10 mg total) by mouth 2 (two) times daily as needed for muscle spasms. 20 tablet Rinaldo Ratel, [place] N, [place]   naproxen (NAPROSYN) 500 MG tablet Take 1 tablet (500 mg total) by mouth 2 (two) times daily. 30 tablet Rinaldo Ratel, [place] N, [place]   predniSONE (DELTASONE) 20 MG tablet Take 2 tablets (40 mg total) by mouth daily for 5 days. 10 tablet Ellarose Brandi, [place] N, FNP      PDMP not reviewed this encounter.   Kaiyu Mirabal, [place] N, [place] 07/12/22 2022

## 2022-07-12 NOTE — ED Triage Notes (Signed)
Patient c/o left sided neck stiffness x 3 days and worsening.  Patient states she has been using a massaging apparatus that has helped some.

## 2022-10-31 IMAGING — DX DG HAND COMPLETE 3+V*R*
3 series · 3 of 3 positions shown · non-contrast
Comparison: 06/29/2020

CLINICAL DATA: Fell, right hand swelling and bruising

EXAM:
RIGHT HAND - COMPLETE 3+ VIEW

[hand pa]
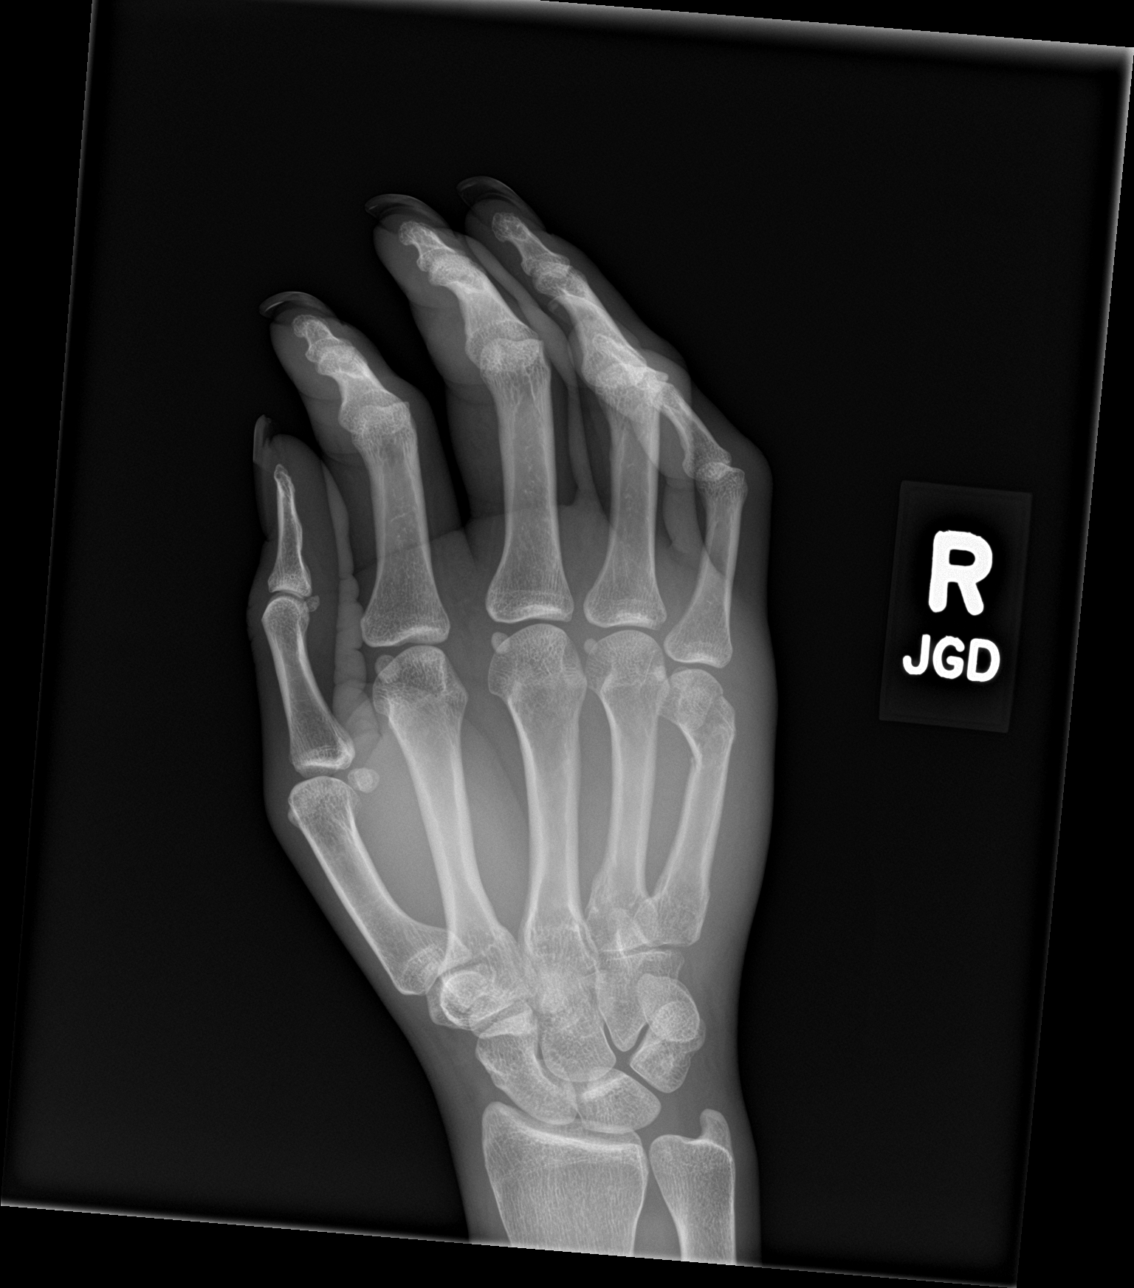

[hand obl]
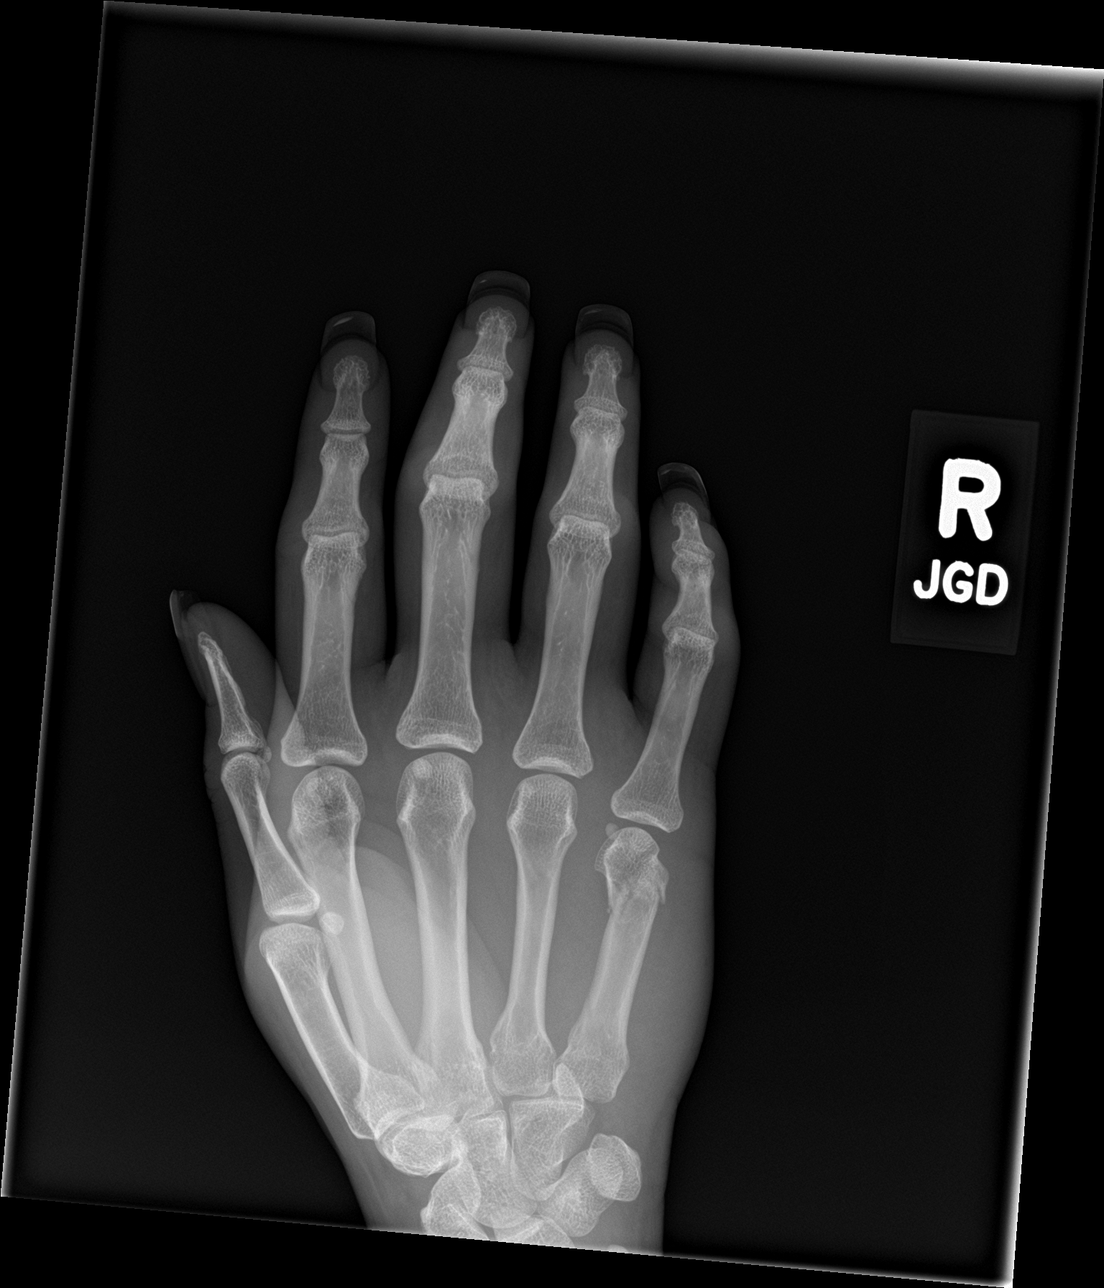

[hand lat]
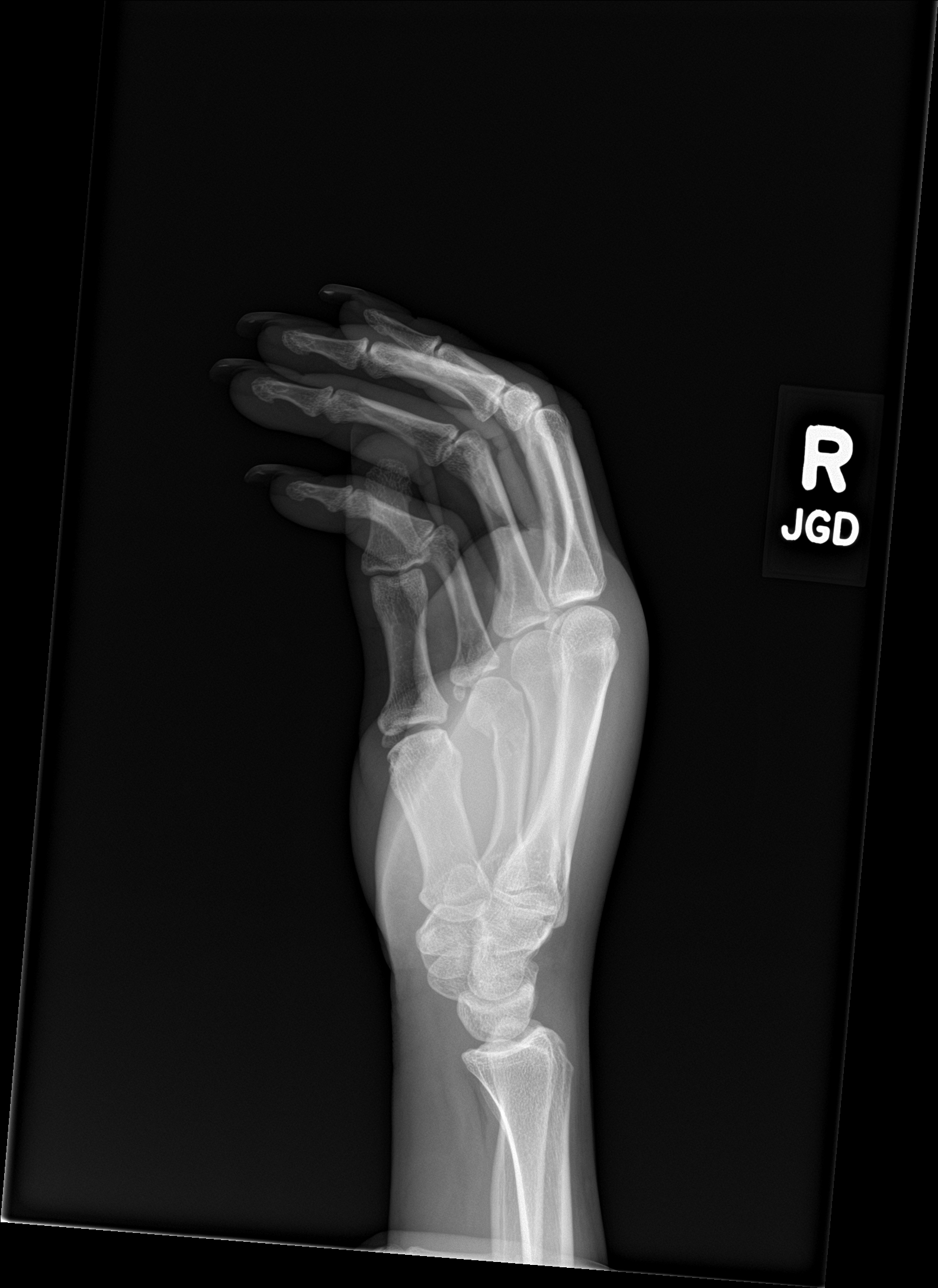

[3 of 3 positions shown; findings below may reference images not displayed]

FINDINGS: Frontal, oblique, lateral views of the right hand are obtained.
There is a comminuted impacted extra-articular fracture involving
the distal aspect of the fifth metacarpal, with dorsal angulation at
the fracture site. Marked overlying soft tissue swelling. No other
acute displaced fractures. Joint spaces are well preserved.
IMPRESSION: 1. Comminuted impacted fracture of the distal aspect fifth
metacarpal, with dorsal angulation at the fracture site.
2. Dorsal soft tissue swelling.
